# Patient Record
Sex: Female | Born: 1935 | Race: White | Hispanic: No
Health system: Southern US, Community
[De-identification: ages and names within clinical notes are randomized; demographics above are authoritative.]

---

## 2016-09-23 DIAGNOSIS — I5033 Acute on chronic diastolic (congestive) heart failure: Secondary | ICD-10-CM | POA: Diagnosis not present

## 2016-09-23 DIAGNOSIS — N182 Chronic kidney disease, stage 2 (mild): Secondary | ICD-10-CM | POA: Diagnosis not present

## 2016-09-23 DIAGNOSIS — E119 Type 2 diabetes mellitus without complications: Secondary | ICD-10-CM | POA: Diagnosis not present

## 2016-09-23 DIAGNOSIS — I160 Hypertensive urgency: Secondary | ICD-10-CM | POA: Diagnosis not present

## 2016-09-23 DIAGNOSIS — E039 Hypothyroidism, unspecified: Secondary | ICD-10-CM

## 2016-09-23 DIAGNOSIS — I480 Paroxysmal atrial fibrillation: Secondary | ICD-10-CM | POA: Diagnosis not present

## 2016-09-23 DIAGNOSIS — I10 Essential (primary) hypertension: Secondary | ICD-10-CM | POA: Diagnosis not present

## 2016-09-24 DIAGNOSIS — I249 Acute ischemic heart disease, unspecified: Secondary | ICD-10-CM | POA: Diagnosis not present

## 2016-09-24 DIAGNOSIS — I10 Essential (primary) hypertension: Secondary | ICD-10-CM | POA: Diagnosis not present

## 2016-09-24 DIAGNOSIS — I480 Paroxysmal atrial fibrillation: Secondary | ICD-10-CM

## 2016-09-24 DIAGNOSIS — R079 Chest pain, unspecified: Secondary | ICD-10-CM | POA: Diagnosis not present

## 2016-09-24 DIAGNOSIS — E119 Type 2 diabetes mellitus without complications: Secondary | ICD-10-CM | POA: Diagnosis not present

## 2016-09-24 DIAGNOSIS — I160 Hypertensive urgency: Secondary | ICD-10-CM

## 2016-09-24 DIAGNOSIS — N182 Chronic kidney disease, stage 2 (mild): Secondary | ICD-10-CM | POA: Diagnosis not present

## 2016-09-24 DIAGNOSIS — E039 Hypothyroidism, unspecified: Secondary | ICD-10-CM | POA: Diagnosis not present

## 2016-09-24 DIAGNOSIS — I5033 Acute on chronic diastolic (congestive) heart failure: Secondary | ICD-10-CM | POA: Diagnosis not present

## 2016-09-25 DIAGNOSIS — R079 Chest pain, unspecified: Secondary | ICD-10-CM

## 2019-03-10 DIAGNOSIS — J90 Pleural effusion, not elsewhere classified: Secondary | ICD-10-CM

## 2019-03-10 DIAGNOSIS — I361 Nonrheumatic tricuspid (valve) insufficiency: Secondary | ICD-10-CM

## 2019-03-10 DIAGNOSIS — I350 Nonrheumatic aortic (valve) stenosis: Secondary | ICD-10-CM

## 2019-03-10 DIAGNOSIS — I160 Hypertensive urgency: Secondary | ICD-10-CM | POA: Diagnosis not present

## 2019-03-11 DIAGNOSIS — I160 Hypertensive urgency: Secondary | ICD-10-CM | POA: Diagnosis not present

## 2019-04-26 DIAGNOSIS — E119 Type 2 diabetes mellitus without complications: Secondary | ICD-10-CM

## 2019-04-26 DIAGNOSIS — N189 Chronic kidney disease, unspecified: Secondary | ICD-10-CM | POA: Diagnosis not present

## 2019-04-26 DIAGNOSIS — I509 Heart failure, unspecified: Secondary | ICD-10-CM

## 2019-04-26 DIAGNOSIS — I160 Hypertensive urgency: Secondary | ICD-10-CM | POA: Diagnosis not present

## 2019-04-26 DIAGNOSIS — R9431 Abnormal electrocardiogram [ECG] [EKG]: Secondary | ICD-10-CM | POA: Diagnosis not present

## 2019-04-27 DIAGNOSIS — R9431 Abnormal electrocardiogram [ECG] [EKG]: Secondary | ICD-10-CM | POA: Diagnosis not present

## 2019-04-27 DIAGNOSIS — I160 Hypertensive urgency: Secondary | ICD-10-CM | POA: Diagnosis not present

## 2019-04-27 DIAGNOSIS — N189 Chronic kidney disease, unspecified: Secondary | ICD-10-CM | POA: Diagnosis not present

## 2019-04-27 DIAGNOSIS — I509 Heart failure, unspecified: Secondary | ICD-10-CM | POA: Diagnosis not present

## 2019-04-27 DIAGNOSIS — I5031 Acute diastolic (congestive) heart failure: Secondary | ICD-10-CM

## 2019-04-28 DIAGNOSIS — I160 Hypertensive urgency: Secondary | ICD-10-CM | POA: Diagnosis not present

## 2019-04-28 DIAGNOSIS — I509 Heart failure, unspecified: Secondary | ICD-10-CM | POA: Diagnosis not present

## 2019-04-28 DIAGNOSIS — N189 Chronic kidney disease, unspecified: Secondary | ICD-10-CM | POA: Diagnosis not present

## 2019-04-28 DIAGNOSIS — R9431 Abnormal electrocardiogram [ECG] [EKG]: Secondary | ICD-10-CM | POA: Diagnosis not present

## 2019-07-19 DIAGNOSIS — E876 Hypokalemia: Secondary | ICD-10-CM

## 2019-07-19 DIAGNOSIS — R531 Weakness: Secondary | ICD-10-CM

## 2019-07-19 DIAGNOSIS — E860 Dehydration: Secondary | ICD-10-CM

## 2019-07-19 DIAGNOSIS — E039 Hypothyroidism, unspecified: Secondary | ICD-10-CM

## 2019-07-19 DIAGNOSIS — N184 Chronic kidney disease, stage 4 (severe): Secondary | ICD-10-CM | POA: Diagnosis not present

## 2019-07-20 DIAGNOSIS — E876 Hypokalemia: Secondary | ICD-10-CM | POA: Diagnosis not present

## 2019-07-20 DIAGNOSIS — E860 Dehydration: Secondary | ICD-10-CM | POA: Diagnosis not present

## 2019-07-20 DIAGNOSIS — N184 Chronic kidney disease, stage 4 (severe): Secondary | ICD-10-CM | POA: Diagnosis not present

## 2019-07-20 DIAGNOSIS — R531 Weakness: Secondary | ICD-10-CM | POA: Diagnosis not present

## 2019-07-21 DIAGNOSIS — E876 Hypokalemia: Secondary | ICD-10-CM | POA: Diagnosis not present

## 2019-07-21 DIAGNOSIS — N184 Chronic kidney disease, stage 4 (severe): Secondary | ICD-10-CM | POA: Diagnosis not present

## 2019-07-21 DIAGNOSIS — R531 Weakness: Secondary | ICD-10-CM | POA: Diagnosis not present

## 2019-07-21 DIAGNOSIS — E860 Dehydration: Secondary | ICD-10-CM | POA: Diagnosis not present

## 2019-07-22 DIAGNOSIS — E876 Hypokalemia: Secondary | ICD-10-CM | POA: Diagnosis not present

## 2019-07-22 DIAGNOSIS — R531 Weakness: Secondary | ICD-10-CM | POA: Diagnosis not present

## 2019-07-22 DIAGNOSIS — E860 Dehydration: Secondary | ICD-10-CM | POA: Diagnosis not present

## 2019-07-22 DIAGNOSIS — N184 Chronic kidney disease, stage 4 (severe): Secondary | ICD-10-CM | POA: Diagnosis not present

## 2019-07-23 DIAGNOSIS — R531 Weakness: Secondary | ICD-10-CM | POA: Diagnosis not present

## 2019-07-23 DIAGNOSIS — I361 Nonrheumatic tricuspid (valve) insufficiency: Secondary | ICD-10-CM | POA: Diagnosis not present

## 2019-07-23 DIAGNOSIS — E860 Dehydration: Secondary | ICD-10-CM | POA: Diagnosis not present

## 2019-07-23 DIAGNOSIS — I350 Nonrheumatic aortic (valve) stenosis: Secondary | ICD-10-CM | POA: Diagnosis not present

## 2019-07-23 DIAGNOSIS — I340 Nonrheumatic mitral (valve) insufficiency: Secondary | ICD-10-CM

## 2019-07-23 DIAGNOSIS — E876 Hypokalemia: Secondary | ICD-10-CM | POA: Diagnosis not present

## 2019-07-23 DIAGNOSIS — N184 Chronic kidney disease, stage 4 (severe): Secondary | ICD-10-CM | POA: Diagnosis not present

## 2019-07-24 DIAGNOSIS — E876 Hypokalemia: Secondary | ICD-10-CM | POA: Diagnosis not present

## 2019-07-24 DIAGNOSIS — N184 Chronic kidney disease, stage 4 (severe): Secondary | ICD-10-CM | POA: Diagnosis not present

## 2019-07-24 DIAGNOSIS — R531 Weakness: Secondary | ICD-10-CM | POA: Diagnosis not present

## 2019-07-24 DIAGNOSIS — E860 Dehydration: Secondary | ICD-10-CM | POA: Diagnosis not present

## 2019-07-25 DIAGNOSIS — R531 Weakness: Secondary | ICD-10-CM | POA: Diagnosis not present

## 2019-07-25 DIAGNOSIS — N184 Chronic kidney disease, stage 4 (severe): Secondary | ICD-10-CM | POA: Diagnosis not present

## 2019-07-25 DIAGNOSIS — E876 Hypokalemia: Secondary | ICD-10-CM | POA: Diagnosis not present

## 2019-07-25 DIAGNOSIS — E860 Dehydration: Secondary | ICD-10-CM | POA: Diagnosis not present

## 2019-07-26 DIAGNOSIS — R531 Weakness: Secondary | ICD-10-CM | POA: Diagnosis not present

## 2019-07-26 DIAGNOSIS — E860 Dehydration: Secondary | ICD-10-CM | POA: Diagnosis not present

## 2019-07-26 DIAGNOSIS — N184 Chronic kidney disease, stage 4 (severe): Secondary | ICD-10-CM | POA: Diagnosis not present

## 2019-07-26 DIAGNOSIS — E876 Hypokalemia: Secondary | ICD-10-CM | POA: Diagnosis not present

## 2019-07-27 DIAGNOSIS — E876 Hypokalemia: Secondary | ICD-10-CM | POA: Diagnosis not present

## 2019-07-27 DIAGNOSIS — R531 Weakness: Secondary | ICD-10-CM | POA: Diagnosis not present

## 2019-07-27 DIAGNOSIS — N184 Chronic kidney disease, stage 4 (severe): Secondary | ICD-10-CM | POA: Diagnosis not present

## 2019-07-27 DIAGNOSIS — E860 Dehydration: Secondary | ICD-10-CM | POA: Diagnosis not present

## 2019-07-28 DIAGNOSIS — N184 Chronic kidney disease, stage 4 (severe): Secondary | ICD-10-CM | POA: Diagnosis not present

## 2019-07-28 DIAGNOSIS — R531 Weakness: Secondary | ICD-10-CM | POA: Diagnosis not present

## 2019-07-28 DIAGNOSIS — E860 Dehydration: Secondary | ICD-10-CM | POA: Diagnosis not present

## 2019-07-28 DIAGNOSIS — E876 Hypokalemia: Secondary | ICD-10-CM | POA: Diagnosis not present

## 2019-07-29 DIAGNOSIS — E876 Hypokalemia: Secondary | ICD-10-CM | POA: Diagnosis not present

## 2019-07-29 DIAGNOSIS — N184 Chronic kidney disease, stage 4 (severe): Secondary | ICD-10-CM | POA: Diagnosis not present

## 2019-07-29 DIAGNOSIS — R531 Weakness: Secondary | ICD-10-CM | POA: Diagnosis not present

## 2019-07-29 DIAGNOSIS — E860 Dehydration: Secondary | ICD-10-CM | POA: Diagnosis not present

## 2019-07-30 ENCOUNTER — Encounter (HOSPITAL_COMMUNITY): Payer: Medicare Other

## 2019-07-30 ENCOUNTER — Inpatient Hospital Stay (HOSPITAL_COMMUNITY): Payer: Medicare Other

## 2019-07-30 ENCOUNTER — Other Ambulatory Visit: Payer: Self-pay

## 2019-07-30 ENCOUNTER — Encounter (HOSPITAL_COMMUNITY): Payer: Self-pay | Admitting: Family Medicine

## 2019-07-30 ENCOUNTER — Inpatient Hospital Stay (HOSPITAL_COMMUNITY)
Admission: AD | Admit: 2019-07-30 | Discharge: 2019-07-30 | DRG: 871 | Disposition: A | Discharge status: Hospice | Payer: Medicare Other | Source: Other Acute Inpatient Hospital | Attending: Internal Medicine | Admitting: Internal Medicine

## 2019-07-30 DIAGNOSIS — D649 Anemia, unspecified: Secondary | ICD-10-CM | POA: Diagnosis present

## 2019-07-30 DIAGNOSIS — L89626 Pressure-induced deep tissue damage of left heel: Secondary | ICD-10-CM | POA: Diagnosis present

## 2019-07-30 DIAGNOSIS — Z66 Do not resuscitate: Secondary | ICD-10-CM | POA: Diagnosis present

## 2019-07-30 DIAGNOSIS — Z20822 Contact with and (suspected) exposure to covid-19: Secondary | ICD-10-CM | POA: Diagnosis present

## 2019-07-30 DIAGNOSIS — Z79899 Other long term (current) drug therapy: Secondary | ICD-10-CM

## 2019-07-30 DIAGNOSIS — G9341 Metabolic encephalopathy: Secondary | ICD-10-CM | POA: Diagnosis present

## 2019-07-30 DIAGNOSIS — Z515 Encounter for palliative care: Secondary | ICD-10-CM | POA: Diagnosis present

## 2019-07-30 DIAGNOSIS — L03116 Cellulitis of left lower limb: Secondary | ICD-10-CM | POA: Diagnosis present

## 2019-07-30 DIAGNOSIS — N179 Acute kidney failure, unspecified: Secondary | ICD-10-CM | POA: Diagnosis present

## 2019-07-30 DIAGNOSIS — L0390 Cellulitis, unspecified: Secondary | ICD-10-CM

## 2019-07-30 DIAGNOSIS — E871 Hypo-osmolality and hyponatremia: Secondary | ICD-10-CM | POA: Diagnosis present

## 2019-07-30 DIAGNOSIS — E876 Hypokalemia: Secondary | ICD-10-CM | POA: Diagnosis present

## 2019-07-30 DIAGNOSIS — N184 Chronic kidney disease, stage 4 (severe): Secondary | ICD-10-CM | POA: Diagnosis present

## 2019-07-30 DIAGNOSIS — R54 Age-related physical debility: Secondary | ICD-10-CM | POA: Diagnosis present

## 2019-07-30 DIAGNOSIS — J9601 Acute respiratory failure with hypoxia: Secondary | ICD-10-CM | POA: Diagnosis present

## 2019-07-30 DIAGNOSIS — I2510 Atherosclerotic heart disease of native coronary artery without angina pectoris: Secondary | ICD-10-CM | POA: Diagnosis present

## 2019-07-30 DIAGNOSIS — L89616 Pressure-induced deep tissue damage of right heel: Secondary | ICD-10-CM | POA: Diagnosis present

## 2019-07-30 DIAGNOSIS — I482 Chronic atrial fibrillation, unspecified: Secondary | ICD-10-CM | POA: Diagnosis present

## 2019-07-30 DIAGNOSIS — L89310 Pressure ulcer of right buttock, unstageable: Secondary | ICD-10-CM | POA: Diagnosis present

## 2019-07-30 DIAGNOSIS — E039 Hypothyroidism, unspecified: Secondary | ICD-10-CM | POA: Diagnosis present

## 2019-07-30 DIAGNOSIS — E1122 Type 2 diabetes mellitus with diabetic chronic kidney disease: Secondary | ICD-10-CM | POA: Diagnosis present

## 2019-07-30 DIAGNOSIS — K922 Gastrointestinal hemorrhage, unspecified: Secondary | ICD-10-CM | POA: Diagnosis present

## 2019-07-30 DIAGNOSIS — T460X5A Adverse effect of cardiac-stimulant glycosides and drugs of similar action, initial encounter: Secondary | ICD-10-CM | POA: Diagnosis present

## 2019-07-30 DIAGNOSIS — L89150 Pressure ulcer of sacral region, unstageable: Secondary | ICD-10-CM | POA: Diagnosis present

## 2019-07-30 DIAGNOSIS — I130 Hypertensive heart and chronic kidney disease with heart failure and stage 1 through stage 4 chronic kidney disease, or unspecified chronic kidney disease: Secondary | ICD-10-CM | POA: Diagnosis present

## 2019-07-30 DIAGNOSIS — I5033 Acute on chronic diastolic (congestive) heart failure: Secondary | ICD-10-CM | POA: Diagnosis present

## 2019-07-30 DIAGNOSIS — R0602 Shortness of breath: Secondary | ICD-10-CM

## 2019-07-30 DIAGNOSIS — I10 Essential (primary) hypertension: Secondary | ICD-10-CM | POA: Diagnosis present

## 2019-07-30 DIAGNOSIS — R1310 Dysphagia, unspecified: Secondary | ICD-10-CM | POA: Diagnosis present

## 2019-07-30 DIAGNOSIS — E1165 Type 2 diabetes mellitus with hyperglycemia: Secondary | ICD-10-CM | POA: Diagnosis present

## 2019-07-30 DIAGNOSIS — A419 Sepsis, unspecified organism: Principal | ICD-10-CM | POA: Diagnosis present

## 2019-07-30 DIAGNOSIS — J90 Pleural effusion, not elsewhere classified: Secondary | ICD-10-CM | POA: Diagnosis not present

## 2019-07-30 DIAGNOSIS — N390 Urinary tract infection, site not specified: Secondary | ICD-10-CM | POA: Insufficient documentation

## 2019-07-30 LAB — CBC WITH DIFFERENTIAL/PLATELET
Abs Immature Granulocytes: 0.21 10*3/uL — ABNORMAL HIGH (ref 0.00–0.07)
Basophils Absolute: 0 10*3/uL (ref 0.0–0.1)
Basophils Relative: 0 %
Eosinophils Absolute: 0 10*3/uL (ref 0.0–0.5)
Eosinophils Relative: 0 %
HCT: 26.3 % — ABNORMAL LOW (ref 36.0–46.0)
Hemoglobin: 8.8 g/dL — ABNORMAL LOW (ref 12.0–15.0)
Immature Granulocytes: 1 %
Lymphocytes Relative: 1 %
Lymphs Abs: 0.3 10*3/uL — ABNORMAL LOW (ref 0.7–4.0)
MCH: 28.1 pg (ref 26.0–34.0)
MCHC: 33.5 g/dL (ref 30.0–36.0)
MCV: 84 fL (ref 80.0–100.0)
Monocytes Absolute: 0.7 10*3/uL (ref 0.1–1.0)
Monocytes Relative: 3 %
Neutro Abs: 20.6 10*3/uL — ABNORMAL HIGH (ref 1.7–7.7)
Neutrophils Relative %: 95 %
Platelets: 183 10*3/uL (ref 150–400)
RBC: 3.13 MIL/uL — ABNORMAL LOW (ref 3.87–5.11)
RDW: 16.5 % — ABNORMAL HIGH (ref 11.5–15.5)
WBC: 21.8 10*3/uL — ABNORMAL HIGH (ref 4.0–10.5)
nRBC: 0.1 % (ref 0.0–0.2)

## 2019-07-30 LAB — COMPREHENSIVE METABOLIC PANEL
ALT: 11 U/L (ref 0–44)
AST: 20 U/L (ref 15–41)
Albumin: 2 g/dL — ABNORMAL LOW (ref 3.5–5.0)
Alkaline Phosphatase: 144 U/L — ABNORMAL HIGH (ref 38–126)
Anion gap: 12 (ref 5–15)
BUN: 99 mg/dL — ABNORMAL HIGH (ref 8–23)
CO2: 21 mmol/L — ABNORMAL LOW (ref 22–32)
Calcium: 9.5 mg/dL (ref 8.9–10.3)
Chloride: 98 mmol/L (ref 98–111)
Creatinine, Ser: 2.55 mg/dL — ABNORMAL HIGH (ref 0.44–1.00)
GFR calc Af Amer: 19 mL/min — ABNORMAL LOW (ref >60)
GFR calc non Af Amer: 16 mL/min — ABNORMAL LOW (ref >60)
Glucose, Bld: 197 mg/dL — ABNORMAL HIGH (ref 70–99)
Potassium: 4.7 mmol/L (ref 3.5–5.1)
Sodium: 131 mmol/L — ABNORMAL LOW (ref 135–145)
Total Bilirubin: 1 mg/dL (ref 0.3–1.2)
Total Protein: 5 g/dL — ABNORMAL LOW (ref 6.5–8.1)

## 2019-07-30 LAB — TYPE AND SCREEN
ABO/RH(D): O POS
Antibody Screen: NEGATIVE

## 2019-07-30 LAB — GLUCOSE, CAPILLARY
Glucose-Capillary: 198 mg/dL — ABNORMAL HIGH (ref 70–99)
Glucose-Capillary: 185 mg/dL — ABNORMAL HIGH (ref 70–99)

## 2019-07-30 LAB — ABO/RH: ABO/RH(D): O POS

## 2019-07-30 LAB — HEMOGLOBIN A1C
Hgb A1c MFr Bld: 7.8 % — ABNORMAL HIGH (ref 4.8–5.6)
Mean Plasma Glucose: 177.16 mg/dL

## 2019-07-30 MED ORDER — PANTOPRAZOLE SODIUM 40 MG PO TBEC
40.0000 mg | DELAYED_RELEASE_TABLET | Freq: Two times a day (BID) | ORAL | Status: DC
  Administered 2019-07-30: 40 mg via ORAL
  Filled 2019-07-30 (×2): qty 1

## 2019-07-30 MED ORDER — LORAZEPAM 2 MG/ML PO CONC: 1.0000 mg | ORAL | Status: DC | PRN

## 2019-07-30 MED ORDER — ONDANSETRON HCL 4 MG/2ML IJ SOLN: 4.0000 mg | Freq: Four times a day (QID) | INTRAMUSCULAR | Status: DC | PRN

## 2019-07-30 MED ORDER — SODIUM CHLORIDE 0.9% IV SOLUTION: Freq: Once | INTRAVENOUS | Status: DC

## 2019-07-30 MED ORDER — SODIUM CHLORIDE 0.9 % IV SOLN: 250.0000 mL | INTRAVENOUS | Status: DC | PRN

## 2019-07-30 MED ORDER — HALOPERIDOL LACTATE 5 MG/ML IJ SOLN: 0.5000 mg | INTRAMUSCULAR | Status: DC | PRN

## 2019-07-30 MED ORDER — VANCOMYCIN HCL IN DEXTROSE 1-5 GM/200ML-% IV SOLN
1000.0000 mg | Freq: Once | INTRAVENOUS | Status: AC
  Administered 2019-07-30: 1000 mg via INTRAVENOUS
  Filled 2019-07-30: qty 200

## 2019-07-30 MED ORDER — HALOPERIDOL 0.5 MG PO TABS
0.5000 mg | ORAL_TABLET | ORAL | Status: DC | PRN
  Filled 2019-07-30: qty 1

## 2019-07-30 MED ORDER — SODIUM CHLORIDE 0.9% FLUSH: 3.0000 mL | Freq: Two times a day (BID) | INTRAVENOUS | Status: DC

## 2019-07-30 MED ORDER — ORAL CARE MOUTH RINSE: 15.0000 mL | Freq: Two times a day (BID) | OROMUCOSAL | Status: DC

## 2019-07-30 MED ORDER — ONDANSETRON 4 MG PO TBDP: 4.0000 mg | ORAL_TABLET | Freq: Four times a day (QID) | ORAL | Status: DC | PRN

## 2019-07-30 MED ORDER — SODIUM CHLORIDE 0.9% FLUSH
3.0000 mL | Freq: Two times a day (BID) | INTRAVENOUS | Status: DC
  Administered 2019-07-30 (×2): 3 mL via INTRAVENOUS

## 2019-07-30 MED ORDER — ACETAMINOPHEN 650 MG RE SUPP: 650.0000 mg | Freq: Four times a day (QID) | RECTAL | Status: DC | PRN

## 2019-07-30 MED ORDER — SODIUM CHLORIDE 0.9% FLUSH: 3.0000 mL | INTRAVENOUS | Status: DC | PRN

## 2019-07-30 MED ORDER — SENNOSIDES-DOCUSATE SODIUM 8.6-50 MG PO TABS: 1.0000 | ORAL_TABLET | Freq: Every evening | ORAL | Status: DC | PRN

## 2019-07-30 MED ORDER — HYDROMORPHONE HCL 1 MG/ML IJ SOLN
0.5000 mg | INTRAMUSCULAR | Status: DC | PRN
  Administered 2019-07-30: 0.5 mg via INTRAVENOUS
  Filled 2019-07-30: qty 1

## 2019-07-30 MED ORDER — HALOPERIDOL LACTATE 2 MG/ML PO CONC
0.5000 mg | ORAL | Status: DC | PRN
  Filled 2019-07-30: qty 0.3

## 2019-07-30 MED ORDER — HYDROCODONE-ACETAMINOPHEN 5-325 MG PO TABS: 1.0000 | ORAL_TABLET | Freq: Four times a day (QID) | ORAL | Status: DC | PRN

## 2019-07-30 MED ORDER — LORAZEPAM 1 MG PO TABS: 1.0000 mg | ORAL_TABLET | ORAL | Status: DC | PRN

## 2019-07-30 MED ORDER — INSULIN ASPART 100 UNIT/ML ~~LOC~~ SOLN
0.0000 [IU] | Freq: Three times a day (TID) | SUBCUTANEOUS | Status: DC
  Administered 2019-07-30: 2 [IU] via SUBCUTANEOUS

## 2019-07-30 MED ORDER — HYDROMORPHONE HCL 1 MG/ML IJ SOLN: 0.5000 mg | INTRAMUSCULAR | 0 refills | Status: AC | PRN

## 2019-07-30 MED ORDER — LORAZEPAM 2 MG/ML PO CONC: 1.0000 mg | ORAL | 0 refills | Status: AC | PRN

## 2019-07-30 MED ORDER — VANCOMYCIN VARIABLE DOSE PER UNSTABLE RENAL FUNCTION (PHARMACIST DOSING): Status: DC

## 2019-07-30 MED ORDER — LORAZEPAM 2 MG/ML IJ SOLN
1.0000 mg | INTRAMUSCULAR | Status: DC | PRN
  Administered 2019-07-30: 1 mg via INTRAVENOUS
  Filled 2019-07-30: qty 1

## 2019-07-30 MED ORDER — CHLORHEXIDINE GLUCONATE CLOTH 2 % EX PADS: 6.0000 | MEDICATED_PAD | Freq: Every day | CUTANEOUS | Status: DC

## 2019-07-30 MED ORDER — ONDANSETRON HCL 4 MG PO TABS: 4.0000 mg | ORAL_TABLET | Freq: Four times a day (QID) | ORAL | Status: DC | PRN

## 2019-07-30 MED ORDER — LORAZEPAM 2 MG/ML IJ SOLN: 1.0000 mg | INTRAMUSCULAR | 0 refills | Status: AC | PRN

## 2019-07-30 MED ORDER — LABETALOL HCL 5 MG/ML IV SOLN: 10.0000 mg | INTRAVENOUS | Status: DC | PRN

## 2019-07-30 MED ORDER — ACETAMINOPHEN 325 MG PO TABS: 650.0000 mg | ORAL_TABLET | Freq: Four times a day (QID) | ORAL | Status: DC | PRN

## 2019-07-30 MED ORDER — SUCRALFATE 1 G PO TABS: 1.0000 g | ORAL_TABLET | Freq: Three times a day (TID) | ORAL | Status: DC

## 2019-07-30 MED ORDER — INSULIN ASPART 100 UNIT/ML ~~LOC~~ SOLN: 0.0000 [IU] | Freq: Every day | SUBCUTANEOUS | Status: DC

## 2019-07-30 MED ORDER — DIPHENHYDRAMINE HCL 50 MG/ML IJ SOLN: 12.5000 mg | INTRAMUSCULAR | Status: DC | PRN

## 2019-07-30 MED ORDER — LEVOTHYROXINE SODIUM 25 MCG PO TABS
25.0000 ug | ORAL_TABLET | Freq: Every day | ORAL | Status: DC
  Administered 2019-07-30: 25 ug via ORAL
  Filled 2019-07-30: qty 1

## 2019-08-26 NOTE — Consult Note (Signed)
WOC Nurse Consult Note: Patient receiving care in [facility] [phone]. Reason for Consult: LE wounds Wound type: Right posterior heel has a DTPI that measures 4 cm x 4 cm and is 100% purple.  A foam dressing is in place to this wound.  The LLE from the knee to the dorsum of the foot is cool, dark purple, and I cannot palpate a dorsalis pedis pulse.  There is a small area just below the knee where some of the overlying skin is missing and has an Aquacel pad over it; otherwise the multiple non-adherent guaze dressings are CDI.  I am assuming the appearance of the leg could be a vascular flow issue.  The origin, however, is not clear to me at this time. Pressure Injury POA: Yes/No/NA Measurement: Wound bed: Drainage (amount, consistency, odor)  Periwound: Dressing procedure/placement/frequency: I have ordered an air mattress with a low air loss feature, bilateral heel lift prevalon boots, and to moisten any dressings adhering to the LLE with saline. The apply non-adherent pads (Telfa or Curad) to LLE. Secure with kerlex. Change Kerlex daily. Change all non-adherent pads with drainage. Monitor the wound area(s) for worsening of condition such as: Signs/symptoms of infection,  Increase in size,  Development of or worsening of odor, Development of pain, or increased pain at the affected locations.  Notify the medical team if any of these develop.  Thank you for the consult.  Discussed plan of care with the patient and bedside nurse.  [place] nurse will not follow at this time.  Please re-consult the [place] team if needed.  Val Riles, RN, MSN, CWOCN, CNS-BC, pager [phone]

## 2019-08-26 NOTE — TOC Initial Note (Signed)
Transition of Care [facility]) - Initial/Assessment Note    Patient Details  Name: Melinda Hernandez MRN: [id number] Date of Birth: 1929-10-26  Transition of Care [facility]) CM/SW Contact:    Bartholomew Crews, RN Phone Number: [phone] 2019/08/14, 12:51 PM  Clinical Narrative:                  Notified by MD of the need for inpatient hospice in [facility]. Spoke with son, Ludwig Clarks, at the bedside. Referral called in to liaison at [place]. TOC following for transition needs.   Expected Discharge Plan: [place] Barriers to Discharge: Continued Medical Work up   Patient Goals and CMS Choice Patient states their goals for this hospitalization and ongoing recovery are:: inpatient hospice at [facility].gov Compare Post Acute Care list provided to:: Patient Represenative (must comment) Choice offered to / list presented to : Adult Children  Expected Discharge Plan and Services Expected Discharge Plan: [place] In-house Referral: Clinical Social Work Discharge Planning Services: CM Consult Post Acute Care Choice: Hospice Living arrangements for the past 2 months: [place]                                      Prior Living Arrangements/Services Living arrangements for the past 2 months: Single Family Home Lives with:: Adult Children, Self                   Activities of Daily Living Home Assistive Devices/Equipment: (confused) ADL Screening (condition at time of admission) Patient's cognitive ability adequate to safely complete daily activities?: No Is the patient deaf or have difficulty hearing?: Yes Does the patient have difficulty seeing, even when wearing glasses/contacts?: No Does the patient have difficulty concentrating, remembering, or making decisions?: Yes Patient able to express need for assistance with ADLs?: No Does the patient have difficulty dressing or bathing?: Yes Independently performs ADLs?:  No Communication: Independent Dressing (OT): Dependent Is this a change from baseline?: Pre-admission baseline Grooming: Dependent Is this a change from baseline?: Pre-admission baseline Feeding: Dependent Is this a change from baseline?: Pre-admission baseline Bathing: Dependent Is this a change from baseline?: Pre-admission baseline Toileting: Dependent Is this a change from baseline?: Pre-admission baseline In/Out Bed: Dependent Is this a change from baseline?: Pre-admission baseline Walks in Home: Dependent Is this a change from baseline?: Pre-admission baseline Does the patient have difficulty walking or climbing stairs?: Yes Weakness of Legs: Both Weakness of Arms/Hands: Both  Permission Sought/Granted                  Emotional Assessment         Alcohol / Substance Use: Not Applicable Psych Involvement: No (comment)  Admission diagnosis:  Sepsis due to cellulitis ([place]) [L03.90, A41.9] Patient Active Problem List   Diagnosis Date Noted  . Symptomatic anemia Aug 14, 2019  . Sepsis due to cellulitis ([place]) 2019/08/14  . CAD (coronary artery disease) 14-Aug-2019  . Hypertension 08/14/19  . CKD (chronic kidney disease), stage IV ([place]) 2019-08-14  . Hypothyroidism 08-14-2019  . Dysphagia 08/14/19  . Uncontrolled diabetes mellitus with hyperglycemia ([place]) 08-14-19  . Acute on chronic diastolic CHF (congestive heart failure) ([place]) August 14, 2019  . Acute respiratory failure with hypoxia ([place]) 14-Aug-2019  . Bilateral pleural effusion 08/14/19   PCP:  Raina Mina., MD Pharmacy:   [place] [place] [facility] [address]  [place] [address] Phone: [phone] Fax: [phone]     Social Determinants of Health (SDOH) Interventions    Readmission Risk Interventions No flowsheet data found.

## 2019-08-26 NOTE — Progress Notes (Signed)
New Admission Note:  Arrival Method: Stretcher/EMS Mental Orientation:Alert to self only. Telemetry: NO Assessment: Completed Skin: Pale looking, BLE cool to touch, Bruising all over, Pressure ulcers to buttock and coccyx, Blood blister? LLE DTI's to both heels IV: NSL x 2 Pain: Denies Tubes: Foley cath  Safety Measures: Safety Fall Prevention Plan initiated.  Admission: Completed 5 M  Orientation: Patient has been orientated to the room, unit and the staff. Welcome booklet given.  Family: None Orders have been reviewed and implemented. Will continue to monitor the patient. Call light has been placed within reach and bed alarm has been activated.   Sima Matas BSN, RN  Phone Number: [phone]

## 2019-08-26 NOTE — Progress Notes (Addendum)
PROGRESS NOTE        PATIENT DETAILS Name: Melinda Hernandez Age: 86 y.o. Sex: female Date of Birth: 03/28/1929 Admit Date: 2019-08-08 Admitting Physician Vianne Bulls, MD PYK:DXIPJA, Clarita Crane., MD  Brief Narrative: Patient is a 86 y.o. female chronic left lower extremity wounds-followed by wound care at home, DM-2, HTN, CKD stage IV, chronic atrial fibrillation-transferred from [place] for evaluation of anemia (concern for GI bleeding).  Summary of [place] health course: Patient presented to [place] with generalized weakness-found to have sepsis from left lower extremity cellulitis and acute metabolic encephalopathy-started on IV antibiotics, IV fluid.  Hospital course complicated by acute hypoxemic respiratory failure secondary to decompensated diastolic heart failure/bilateral pleural effusions, AKI, and persistent anemia requiring a total of 6 units of PRBC.  Although FOBT was negative-there was still concern for possible GI bleeding.  During the course at [facility] health-cellulitis involving her left lower extremity-became purulent with concern for left leg abscess.  Significant events: 6/4>> transfer from [facility].  Significant studies: 6/4: CT abdomen/pelvis>> pending 6/4: CT left lower extremity>> pending 5/28: CT head>> motion degraded study, progression of atrophy and chronic microvascular ischemia since 2018 5/25: CT left lower extremity (at [facility])>> significant diffuse subcutaneous soft tissue swelling/edema/fluid consistent with severe cellulitis-no discrete drainable abscess.  No CT findings for myositis/pyomyositis. 5/25: CT pelvis (at [facility])>> sigmoid diverticulitis without diverticulitis, calcified/noncalcified uterine leiomyomata, increased stool in rectum, scattered subcutaneous edema throughout pelvis, perineum and proximal thigh question related to cellulitis or soft tissue edema/anasarca  Antimicrobial therapy: Vancomycin:  6/4>> Zyvox: 5/25>> 6/2 (at [facility]) Ancef: 6/2>> 6/3 (at [facility]) Diflucan: 6/2>> 6/3 (at [facility]) Ceftriaxone: 5/24 x 1(at [facility]) IV clindamycin 5/24 x 1 (at [facility])  Microbiology data: None  Procedures : None  Consults: Ortho  DVT Prophylaxis : SCD's  Subjective: Slow/lethargic- very slow to answer questions.  Assessment/Plan: Sepsis secondary to left lower extremity purulent cellulitis-abscess: Sepsis physiology is improved-however continues to have persistent leukocytosis.  Reportedly cultures at [facility] negative.  On exam-has purulent discharge from the opening in his left leg-see pictures below.  Repeat CT of the leg to see how extensive this abscess is-continue vancomycin-I have consulted orthopedics.   She appears in very poor overall health-advanced age-very frail-will not tolerate extensive procedures-we will await orthopedic evaluation-but have touched base with patient's who understands there will be limitations to her care-and that if patient worsens significantly-that hospice will be more than appropriate.  PAD: Left lower extremity somewhat colder than contralateral right lower extremity.  Obtain ABI.  AKI on CKD stage IV: AKI likely hemodynamically mediated in the setting of sepsis, acute illness-BUN is significantly elevated.  Supportive care for now-given how frail she is currently-do not think she would be a good long-term candidate for hemodialysis.  Acute hypoxemic respiratory failure secondary to decompensated diastolic heart failure/bilateral pleural effusions: Supportive care-due to worsening renal function-diuretics on hold.  Given how tenuous overall situation is-and the fact that she is only requiring 2 L of oxygen-and was able to lie flat-suspect we can hold off on thoracocentesis.  Anemia: Multifactorial etiology-suspect related to acute illness/AKI.  Concern for occult GI bleeding-however FOBT negative at [place]-Per son-patient is having brown stools.  Check CT  abdomen/pelvis to rule out retroperitoneal hematoma.  Given her frailty/tenuous overall clinical situation-hold off on endoscopic evaluation/GI consultation-especially since her stools are brown and negative FOBT.  Follow CBC-continue to hold antiplatelets.  ??  GI bleed: See above-we will asked RN to look at stools to see if bloody/black-on PPI for now.  See above regarding plans to hold off on GI/endoscopic evaluation for now.  Digoxin toxicity: Apparently received DigiFab at [facility].  Repeat digoxin level at [facility] on 5/31 was reportedly normal.  Acute metabolic encephalopathy: Multifactorial etiology from AKI, sepsis-still slow/lethargic and not at baseline.  CT head done at [facility] was negative for acute abnormalities.  She has a nonfocal exam-doubt any further benefit from further imaging.  Hopefully once her metabolic derangements are better-she will improve.  Dysphagia: Apparently was on a pured diet at [facility] health-repeat SLP eval.  HTN: BP reasonable-all antihypertensives on hold-on as needed labetalol.  DM-2: CBGs relatively stable-continue SSI-allow permissive hyperglycemia-not a candidate for tight glycemic control.  Oral hypoglycemic agents remain on hold  Recent Labs    08/20/19 0812  GLUCAP 198*   Hypothyroidism: Continue Synthroid  History of chronic atrial fibrillation: No longer on digoxin-due to concern for digoxin toxicity-rate currently controlled-appears to be in A. fib on telemetry monitoring.  Not a candidate for anticoagulation currently given severe anemia requiring PRBC transfusion and concern for GI bleeding.  History of CAD: No anginal symptoms-apparently was on aspirin/Plavix/Imdur prior to hospitalization at [facility] health-currently all antiplatelet agents on hold given severe anemia/concern for GI bleeding.  Anasarca/bilateral pleural effusions: Probably secondary to hypoalbuminemia, chronic diastolic heart failure.  Once  clinical situation is more stable-we can consider resuming diuretics.  Suspect the pleural effusion is transudate of-once a bit more stable-we can consider diagnostic thoracocentesis at some point.  She is able to lie flat-and only on 2 L of oxygen-therefore do not think we need to pursue thoracocentesis right away.  Funguria: Apparently was on Diflucan at [place] care: DNR in place.  Very frail-elderly female-with long hospital stay at [facility] health-transferred to [facility] for evaluation of anemia, heart failure, concern for GI bleeding.  Her left lower extremity-appears to have purulent drainage-and probably underlying abscess-left lower extremity appears to be cold compared to the contralateral leg-concern for underlying PAD.  Given her multiple medical comorbidities-severe frailty/deconditioning-advanced age-overall prognosis is very poor-do not think she will tolerate aggressive procedures.  Have touched base with patient's son over the phone this morning-he understands-have placed palliative care evaluation.  If patient declines significantly-she will need to be transition to full comfort measures.   Diet: Diet Order            Diet NPO time specified Except for: Ice Chips, Sips with Meds  Diet effective now               Code Status:  DNR  Family Communication: Spoke with son over phone  Disposition Plan: Status is: Inpatient  Remains inpatient appropriate because:Inpatient level of care appropriate due to severity of illness   Dispo: The patient is from: Home              Anticipated d/c is to: TBD              Anticipated d/c date is: > 3 days              Patient currently is not medically stable to d/c.        Barriers to Discharge:  Antimicrobial agents: Anti-infectives (From admission, onward)   Start     Dose/Rate Route Frequency Ordered Stop   2019/08/20 0430  vancomycin (VANCOCIN) IVPB 1000 mg/200  mL premix     1,000 mg 200 mL/hr over 60  Minutes Intravenous  Once 08-25-19 0415 August 25, 2019 0610   August 25, 2019 0415  vancomycin variable dose per unstable renal function (pharmacist dosing)      Does not apply See admin instructions 08/25/19 0415         Time spent: 35 minutes-Greater than 50% of this time was spent in counseling, explanation of diagnosis, planning of further management, and coordination of care.  MEDICATIONS: Scheduled Meds: . sodium chloride   Intravenous Once  . Chlorhexidine Gluconate Cloth  6 each Topical Daily  . insulin aspart  0-5 Units Subcutaneous QHS  . insulin aspart  0-9 Units Subcutaneous TID WC  . levothyroxine  25 mcg Oral Q0600  . mouth rinse  15 mL Mouth Rinse BID  . pantoprazole  40 mg Oral BID  . sodium chloride flush  3 mL Intravenous Q12H  . sodium chloride flush  3 mL Intravenous Q12H  . sucralfate  1 g Oral TID WC & HS  . vancomycin variable dose per unstable renal function (pharmacist dosing)   Does not apply See admin instructions   Continuous Infusions: . sodium chloride     PRN Meds:.sodium chloride, acetaminophen **OR** acetaminophen, HYDROcodone-acetaminophen, labetalol, ondansetron **OR** ondansetron (ZOFRAN) IV, senna-docusate, sodium chloride flush   PHYSICAL EXAM: Vital signs: Vitals:   2019-08-25 0052 08-25-2019 0400 August 25, 2019 0422 2019/08/25 0812  BP: (!) 178/54  (!) 141/53 (!) 156/62  Pulse: 60  75 71  Resp: 19  19 16   Temp: 98.2 F (36.8 C)  97.9 F (36.6 C) 98.2 F (36.8 C)  TempSrc:    Oral  SpO2: 98%  93% 92%  Weight:  47.9 kg     Filed Weights   2019-08-25 0400  Weight: 47.9 kg   There is no height or weight on file to calculate BMI.   Gen Exam: Awake-slow-lethargic but not in any distress. HEENT:atraumatic, normocephalic Chest: B/L clear to auscultation anteriorly CVS:S1S2 irregular Abdomen:soft non tender, non distended Extremities: See picture below.  Left lower extremity appears to be colder than right lower extremity. Neurology: Difficult exam-but  seems to be moving all 4 extremities. Skin: no rash           I have personally reviewed following labs and imaging studies  LABORATORY DATA: CBC: Recent Labs  Lab 08-25-2019 0450  WBC 21.8*  NEUTROABS 20.6*  HGB 8.8*  HCT 26.3*  MCV 84.0  PLT 160    Basic Metabolic Panel: Recent Labs  Lab August 25, 2019 0450  NA 131*  K 4.7  CL 98  CO2 21*  GLUCOSE 197*  BUN 99*  CREATININE 2.55*  CALCIUM 9.5    GFR: CrCl cannot be calculated (Unknown ideal weight.).  Liver Function Tests: Recent Labs  Lab 08-25-19 0450  AST 20  ALT 11  ALKPHOS 144*  BILITOT 1.0  PROT 5.0*  ALBUMIN 2.0*   No results for input(s): LIPASE, AMYLASE in the last 168 hours. No results for input(s): AMMONIA in the last 168 hours.  Coagulation Profile: No results for input(s): INR, PROTIME in the last 168 hours.  Cardiac Enzymes: No results for input(s): CKTOTAL, CKMB, CKMBINDEX, TROPONINI in the last 168 hours.  BNP (last 3 results) No results for input(s): PROBNP in the last 8760 hours.  Lipid Profile: No results for input(s): CHOL, HDL, LDLCALC, TRIG, CHOLHDL, LDLDIRECT in the last 72 hours.  Thyroid Function Tests: No results for input(s): TSH, T4TOTAL, FREET4, T3FREE, THYROIDAB in the last  72 hours.  Anemia Panel: No results for input(s): VITAMINB12, FOLATE, FERRITIN, TIBC, IRON, RETICCTPCT in the last 72 hours.  Urine analysis: No results found for: COLORURINE, APPEARANCEUR, LABSPEC, PHURINE, GLUCOSEU, HGBUR, BILIRUBINUR, KETONESUR, PROTEINUR, UROBILINOGEN, NITRITE, LEUKOCYTESUR  Sepsis Labs: Lactic Acid, Venous No results found for: LATICACIDVEN  MICROBIOLOGY: No results found for this or any previous visit (from the past 240 hour(s)).  RADIOLOGY STUDIES/RESULTS: No results found.   LOS: 0 days   Oren Binet, MD  [facility]    To contact the attending provider between 7A-7P or the covering provider during after hours 7P-7A, please log into the web  site [email or web address] and access using universal  password for that web site. If you do not have the password, please call the hospital operator.  August 22, 2019, 10:13 AM

## 2019-08-26 NOTE — Discharge Summary (Signed)
PATIENT DETAILS Name: Melinda Hernandez Age: 86 y.o. Sex: female Date of Birth: 1929-04-29 MRN: [id number]. Admitting Physician: Vianne Bulls, MD [facility]:[id number], Clarita Crane., MD  Admit Date: 2019-08-07 Discharge date: 08/07/19  Recommendations for Outpatient Follow-up:  1. Optimize comfort care.  Admitted From:  Transferred from [place].  Disposition: Residential Hospice     Home Health: No  Equipment/Devices: None  Discharge Condition: Stable  CODE STATUS:  DNR  Diet recommendation:  Diet Order            Diet - low sodium heart healthy               Brief Narrative: Patient is a 86 y.o. female chronic left lower extremity wounds-followed by wound care at home, DM-2, HTN, CKD stage IV, chronic atrial fibrillation-transferred from [place] for evaluation of anemia (concern for GI bleeding).  Summary of [place] [facility] course: Patient presented to [place] with generalized weakness-found to have sepsis from left lower extremity cellulitis and acute metabolic encephalopathy-started on IV antibiotics, IV fluid.  Hospital course complicated by acute hypoxemic respiratory failure secondary to decompensated diastolic heart failure/bilateral pleural effusions, AKI, and persistent anemia requiring a total of 6 units of PRBC.  Although FOBT was negative-there was still concern for possible GI bleeding.  During the course at [facility] health-cellulitis involving her left lower extremity-became purulent with concern for left leg abscess.  Significant events: 6/4>> transfer from [facility]. 6/4>> after extensive discussion with family-transition to full comfort measures-residential hospice on discharge.  Significant studies: 6/4: CT abdomen/pelvis>> pending 6/4: CT left lower extremity>> diffuse/severe cellulitis of the entire left lower extremity and pelvic area, extensive gas throughout the subcutaneous tissue-findings highly worrisome of gas  gangrene. 5/28: CT head>> motion degraded study, progression of atrophy and chronic microvascular ischemia since 2018 5/25: CT left lower extremity (at [facility])>> significant diffuse subcutaneous soft tissue swelling/edema/fluid consistent with severe cellulitis-no discrete drainable abscess.  No CT findings for myositis/pyomyositis. 5/25: CT pelvis (at [facility])>> sigmoid diverticulitis without diverticulitis, calcified/noncalcified uterine leiomyomata, increased stool in rectum, scattered subcutaneous edema throughout pelvis, perineum and proximal thigh question related to cellulitis or soft tissue edema/anasarca  Antimicrobial therapy: Vancomycin: 6/4>> 6/4 Zyvox: 5/25>> 6/2 (at [facility]) Ancef: 6/2>> 6/3 (at [facility]) Diflucan: 6/2>> 6/3 (at [facility]) Ceftriaxone: 5/24 x 1(at [facility]) IV clindamycin 5/24 x 1 (at [facility])  Microbiology data: None  Procedures : None  Consults: Ortho  Brief Hospital Course: Sepsis secondary to left lower extremity purulent cellulitis-abscess:  Sepsis physiology improved-but continues to have significant leukocytosis.  On exam this morning had purulence from left leg-CT suggestive of gas in left lower extremity-with ongoing severe diffuse cellulitis.  Orthopedics consulted-recommendations were to proceed with I&D-but given patient's advanced age/tenuous clinical condition/frailty-family instead decided to transition to full comfort measures.  See discussion below.  All antibiotics were discontinued-comfort measures started.    PAD: Left lower extremity somewhat colder than contralateral right lower extremity.    Plans were to obtain ABI-however given extensive wounds-this was not possible.  Highly suspicious for underlying PAD and critical ischemia.  Family does not desire any further work-up including vascular surgery-have transition patient to full comfort measures.  AKI on CKD stage IV: AKI likely hemodynamically mediated in the setting of sepsis, acute illness-BUN is significantly  elevated.  Supportive care for now-given how frail she is currently-do not think she would be a good long-term candidate for hemodialysis.  In any event-family has decided to transition to full comfort measures.  Acute hypoxemic  respiratory failure secondary to decompensated diastolic heart failure/bilateral pleural effusions: All diuretic agents were held due to worsening AKI.  Thoracocentesis was contemplated but given tenuous clinical situation-in the fact that she was able to lie flat and was only on 2 L of oxygen-this was postponed.  As noted above-patient now has been transitioned to full comfort measures.    Anemia: Multifactorial etiology-suspect related to acute illness/AKI.  Concern for occult GI bleeding-however FOBT negative at [place]-Per son-patient is having brown stools.  Plans were to repeat a CT abdomen/pelvis to rule out a retroperitoneal hematoma.  Given frailty/tenuous clinical condition-all endoscopic procedures that were contemplated were held.  Goals of care discussion was done by this MD with family-starting comfort measures.   ??  GI bleed: Concern for occult GI bleeding-even though FOBT negative-as hemoglobin did not correct even with multiple units of PRBC transfusion.  Placed on empiric PPI-given frailty/tenuous clinical condition-not felt to be a good candidate for immediate endoscopic evaluation-after extensive discussion with family-have transition to full comfort measures.   Digoxin toxicity: Apparently received DigiFab at [facility].  Repeat digoxin level at [facility] on 5/31 was reportedly normal.  Acute metabolic encephalopathy: Multifactorial etiology from AKI, sepsis-still slow/lethargic/somnolent and not at baseline.  CT head done at [facility] was negative for acute abnormalities.  She has a nonfocal exam-doubt any further benefit from further imaging.Marland Kitchen  Dysphagia: Apparently was on a pured diet at [place]  HTN: BP  reasonable-all antihypertensives on hold  DM-2: CBGs relatively stable-all insulin products discontinued-comfort measures in effect.  Hypothyroidism:  No longer on Synthroid-comfort measures in effect.  History of chronic atrial fibrillation: No longer on digoxin-due to concern for digoxin toxicity-rate currently controlled-appears to be in A. fib on telemetry monitoring.  Not a candidate for anticoagulation currently given severe anemia requiring PRBC transfusion and concern for GI bleeding.  History of CAD: No anginal symptoms-apparently was on aspirin/Plavix/Imdur prior to hospitalization at [facility] health-currently all antiplatelet agents on hold given severe anemia/concern for GI bleeding.  Anasarca/bilateral pleural effusions: Probably secondary to hypoalbuminemia, chronic diastolic heart failure.  Plans were to follow-and to resume diuretics and consider thoracocentesis once clinical situation improved.  However now has been transitioned to comfort measures.   Funguria: Apparently was on Diflucan at [place] care/goals of care: DNR already in place.  Very frail-elderly female-with long hospital stay at [facility] health-transferred to [facility] for evaluation of anemia, heart failure, concern for GI bleeding.  Her left lower extremity-appears to have purulent drainage-and probably underlying abscess-left lower extremity appears to be cold compared to the contralateral leg-concern for underlying PAD with critical limb ischemia.  Given her multiple medical comorbidities-severe frailty/deconditioning-advanced age-overall prognosis is very poor-do not think she will tolerate aggressive procedures.  Discussed with son over the phone this morning-and then at bedside-he understands that patient is deteriorating slowly-and that further procedures/surgery mainly delayed inevitable.  He also thinks that his mother has suffered enough-and does not wish to proceed with any further  investigation/surgery for left leg and to transition to full comfort measures-and wants to see if we can get his mother to [facility] where she will be closer to family.  He understands that she will likely pass within a few days-a week or so.   RN pressure injury documentation: Pressure Injury August 01, 2019 Coccyx Medial Unstageable - Full thickness tissue loss in which the base of the injury is covered by slough (yellow, tan, gray, green or brown) and/or eschar (tan, brown or black)  in the wound bed. yellow eschar (Active)  08/03/2019 0125  Location: Coccyx  Location Orientation: Medial  Staging: Unstageable - Full thickness tissue loss in which the base of the injury is covered by slough (yellow, tan, gray, green or brown) and/or eschar (tan, brown or black) in the wound bed.  Wound Description (Comments): yellow eschar  Present on Admission: Yes     Pressure Injury 2019-08-03 Buttocks Right;Medial Unstageable - Full thickness tissue loss in which the base of the injury is covered by slough (yellow, tan, gray, green or brown) and/or eschar (tan, brown or black) in the wound bed. yellow eschar (Active)  08-03-19 0128  Location: Buttocks  Location Orientation: Right;Medial  Staging: Unstageable - Full thickness tissue loss in which the base of the injury is covered by slough (yellow, tan, gray, green or brown) and/or eschar (tan, brown or black) in the wound bed.  Wound Description (Comments): yellow eschar  Present on Admission: Yes     Pressure Injury 08-03-19 Heel Left Deep Tissue Pressure Injury - Purple or maroon localized area of discolored intact skin or blood-filled blister due to damage of underlying soft tissue from pressure and/or shear. purple, soft (Active)  Aug 03, 2019 0130  Location: Heel  Location Orientation: Left  Staging: Deep Tissue Pressure Injury - Purple or maroon localized area of discolored intact skin or blood-filled blister due to damage of underlying soft tissue from  pressure and/or shear.  Wound Description (Comments): purple, soft  Present on Admission: Yes     Pressure Injury August 03, 2019 Heel Right Deep Tissue Pressure Injury - Purple or maroon localized area of discolored intact skin or blood-filled blister due to damage of underlying soft tissue from pressure and/or shear. purple (Active)  Aug 03, 2019 0132  Location: Heel  Location Orientation: Right  Staging: Deep Tissue Pressure Injury - Purple or maroon localized area of discolored intact skin or blood-filled blister due to damage of underlying soft tissue from pressure and/or shear.  Wound Description (Comments): purple  Present on Admission: Yes     Pressure Injury 08/03/19 Heel Left;Lateral Deep Tissue Pressure Injury - Purple or maroon localized area of discolored intact skin or blood-filled blister due to damage of underlying soft tissue from pressure and/or shear. (Active)  August 03, 2019 0135  Location: Heel  Location Orientation: Left;Lateral  Staging: Deep Tissue Pressure Injury - Purple or maroon localized area of discolored intact skin or blood-filled blister due to damage of underlying soft tissue from pressure and/or shear.  Wound Description (Comments):   Present on Admission: Yes     Pressure Injury 08-03-19 Heel Left;Lateral;Proximal Deep Tissue Pressure Injury - Purple or maroon localized area of discolored intact skin or blood-filled blister due to damage of underlying soft tissue from pressure and/or shear. purple (Active)  08/03/19 0137  Location: Heel  Location Orientation: Left;Lateral;Proximal  Staging: Deep Tissue Pressure Injury - Purple or maroon localized area of discolored intact skin or blood-filled blister due to damage of underlying soft tissue from pressure and/or shear.  Wound Description (Comments): purple  Present on Admission: Yes     Discharge Diagnoses:  Principal Problem:   Sepsis due to cellulitis [facility]) Active Problems:   Symptomatic anemia   CAD (coronary  artery disease)   Hypertension   CKD (chronic kidney disease), stage IV (HCC)   Hypothyroidism   Dysphagia   Uncontrolled diabetes mellitus with hyperglycemia (HCC)   Acute on chronic diastolic CHF (congestive heart failure) (HCC)   Acute respiratory failure with hypoxia (HCC)   Bilateral pleural  effusion   Discharge Instructions:  Activity:  As tolerated with Full fall precautions use walker/cane & assistance as needed  Discharge Instructions    Diet - low sodium heart healthy   Complete by: As directed    Discharge wound care:   Complete by: As directed    moisten any dressings adhering to the LLE with saline. Then apply non-adherent pads (Telfa or Curad) to LLE. Secure with kerlex. Change Kerlex daily. Change all non-adherent pads with drainage.   Increase activity slowly   Complete by: As directed      Allergies as of 08-17-2019      Reactions   Bisoprolol Shortness Of Breath   Clonidine Shortness Of Breath   Contrast Media [iodinated Diagnostic Agents] Anaphylaxis   Doxazosin Shortness Of Breath   Metoprolol Shortness Of Breath   Ace Inhibitors Cough   Amlodipine Swelling      Medication List    STOP taking these medications   amLODipine 10 MG tablet Commonly known as: NORVASC   aspirin EC 81 MG tablet   clopidogrel 75 MG tablet Commonly known as: PLAVIX   digoxin 0.125 MG tablet Commonly known as: LANOXIN   furosemide 20 MG tablet Commonly known as: LASIX   glipiZIDE 10 MG 24 hr tablet Commonly known as: GLUCOTROL XL   hydrALAZINE 100 MG tablet Commonly known as: APRESOLINE   isosorbide mononitrate 60 MG 24 hr tablet Commonly known as: IMDUR   levothyroxine 25 MCG tablet Commonly known as: SYNTHROID   pantoprazole 40 MG tablet Commonly known as: PROTONIX   sitaGLIPtin 100 MG tablet Commonly known as: JANUVIA   traMADol 50 MG tablet Commonly known as: ULTRAM     TAKE these medications   HYDROmorphone 1 MG/ML injection Commonly known as:  DILAUDID Inject 0.5 mLs (0.5 mg total) into the vein every 2 (two) hours as needed for severe pain (or dyspnea).   LORazepam 2 MG/ML injection Commonly known as: ATIVAN Inject 0.5 mLs (1 mg total) into the vein every 4 (four) hours as needed for anxiety.   LORazepam 2 MG/ML concentrated solution Commonly known as: ATIVAN Place 0.5 mLs (1 mg total) under the tongue every 4 (four) hours as needed for anxiety.            Discharge Care Instructions  (From admission, onward)         Start     Ordered   Aug 17, 2019 0000  Discharge wound care:    Comments: moisten any dressings adhering to the LLE with saline. Then apply non-adherent pads (Telfa or Curad) to LLE. Secure with kerlex. Change Kerlex daily. Change all non-adherent pads with drainage.   08-17-2019 1635         Follow-up Information    Raina Mina., MD Follow up.   Specialty: Internal Medicine Why: as needed Contact information: [address] [place] [address] [phone]          Allergies  Allergen Reactions  . Bisoprolol Shortness Of Breath  . Clonidine Shortness Of Breath  . Contrast Media [Iodinated Diagnostic Agents] Anaphylaxis  . Doxazosin Shortness Of Breath  . Metoprolol Shortness Of Breath  . Ace Inhibitors Cough  . Amlodipine Swelling    Other Procedures/Studies: CT ABDOMEN PELVIS WO CONTRAST  Result Date: 2019-08-17 CLINICAL DATA:  Anemia. EXAM: CT ABDOMEN AND PELVIS WITHOUT CONTRAST TECHNIQUE: Multidetector CT imaging of the abdomen and pelvis was performed following the standard protocol without IV contrast. COMPARISON:  July 29, 2019. FINDINGS: Lower chest:  Moderate bilateral pleural effusions are noted with adjacent atelectasis of both lower lobes. Hepatobiliary: Cholelithiasis is noted. No biliary dilatation is noted. No definite abnormality seen in the liver on these unenhanced images. Pancreas: Unremarkable. No pancreatic ductal dilatation or surrounding inflammatory changes.  Spleen: Normal in size without focal abnormality. Adrenals/Urinary Tract: Adrenal glands and kidneys are unremarkable. No hydronephrosis or renal obstruction is noted. No renal or ureteral calculi are noted. Urinary bladder is decompressed secondary to Foley catheter. Stomach/Bowel: The stomach appears normal. There is no evidence of bowel obstruction or inflammation. Sigmoid diverticulosis is noted without inflammation. The appendix is not visualized. Vascular/Lymphatic: Aortic atherosclerosis. No enlarged abdominal or pelvic lymph nodes. Reproductive: Calcified uterine fibroid is noted. No definite adnexal abnormality is noted. Other: Moderate ascites is again noted as well as moderate anasarca. Musculoskeletal: No acute or significant osseous findings. IMPRESSION: 1. Moderate bilateral pleural effusions are noted with adjacent atelectasis of both lower lobes. 2. Moderate ascites is noted as well as moderate anasarca. 3. Cholelithiasis. 4. Sigmoid diverticulosis without inflammation. 5. Calcified uterine fibroid. Aortic Atherosclerosis (ICD10-I70.0). Electronically Signed   By: Marijo Conception M.D.   On: 08/07/19 11:54   DG Chest 1 View  Result Date: Aug 07, 2019 CLINICAL DATA:  Shortness of breath EXAM: CHEST  1 VIEW COMPARISON:  Jul 23, 2019 FINDINGS: There is airspace opacity in the left upper lobe, similar to recent study. There is consolidation in the left base with small left pleural effusion. The right lung is clear. There is cardiomegaly with pulmonary venous hypertension. No adenopathy. There is aortic atherosclerosis. There is arthropathy in each shoulder. IMPRESSION: Cardiomegaly with pulmonary vascular congestion. Left pleural effusion. Suspect a degree of congestive heart failure. Suspect superimposed pneumonia left upper lobe, although alveolar edema could present in this manner. Both pneumonia and alveolar edema can be present concurrently. Consolidation in the left base may represent atelectasis  and/or pneumonia. Aortic Atherosclerosis (ICD10-I70.0). Electronically Signed   By: Lowella Grip III M.D.   On: 2019-08-07 11:51   CT EXTREMITY LOWER LEFT WO CONTRAST  Result Date: 2019/08/07 CLINICAL DATA:  Left lower extremity pain, swelling and abscess. EXAM: CT OF THE LOWER LEFT EXTREMITY WITHOUT CONTRAST TECHNIQUE: Multidetector CT imaging of the lower left extremity was performed according to the standard protocol. COMPARISON:  None. FINDINGS: Diffuse and severe changes of cellulitis involving the entire left lower extremity and lower pelvic area. Extensive gas throughout the subcutaneous tissues of the left lower extremity below the knee. There are also a few areas of gas in the muscular compartments. Findings highly worrisome for gas gangrene. No obvious necrotizing fasciitis at this point. No evidence of pyomyositis. There is a small knee joint effusion but I do not see any findings suspicious for septic arthritis. Similar findings at the hip and ankle joints. No findings suspicious for osteomyelitis involving the femur, tibia or fibula. Advanced vascular calcifications are noted. IMPRESSION: 1. Diffuse and severe changes of cellulitis involving the entire left lower extremity and lower pelvic area. 2. Extensive gas throughout the subcutaneous tissues of the left lower extremity below the knee. There are also a few areas of gas in the muscular compartments. Findings highly worrisome for gas gangrene. No obvious necrotizing fasciitis at this point. 3. No findings suspicious for septic arthritis or osteomyelitis. 4. Small knee joint effusion. Electronically Signed   By: Marijo Sanes M.D.   On: Aug 07, 2019 12:58     TODAY-DAY OF DISCHARGE:  Subjective:   Lauriel Helin today appears very somnolent.  Objective:   Blood pressure (!) 156/62, pulse 71, temperature 98.2 F (36.8 C), temperature source Oral, resp. rate 16, height 4\' 9"  (1.448 m), weight 47.9 kg, SpO2 92 %.  Intake/Output Summary  (Last 24 hours) at 08-20-2019 1635 Last data filed at 20-Aug-2019 1453 Gross per 24 hour  Intake 215.16 ml  Output 675 ml  Net -459.84 ml   Filed Weights   08/20/19 0400  Weight: 47.9 kg    Exam: Appears very somnolent.   PERTINENT RADIOLOGIC STUDIES: CT ABDOMEN PELVIS WO CONTRAST  Result Date: 08-20-2019 CLINICAL DATA:  Anemia. EXAM: CT ABDOMEN AND PELVIS WITHOUT CONTRAST TECHNIQUE: Multidetector CT imaging of the abdomen and pelvis was performed following the standard protocol without IV contrast. COMPARISON:  July 29, 2019. FINDINGS: Lower chest: Moderate bilateral pleural effusions are noted with adjacent atelectasis of both lower lobes. Hepatobiliary: Cholelithiasis is noted. No biliary dilatation is noted. No definite abnormality seen in the liver on these unenhanced images. Pancreas: Unremarkable. No pancreatic ductal dilatation or surrounding inflammatory changes. Spleen: Normal in size without focal abnormality. Adrenals/Urinary Tract: Adrenal glands and kidneys are unremarkable. No hydronephrosis or renal obstruction is noted. No renal or ureteral calculi are noted. Urinary bladder is decompressed secondary to Foley catheter. Stomach/Bowel: The stomach appears normal. There is no evidence of bowel obstruction or inflammation. Sigmoid diverticulosis is noted without inflammation. The appendix is not visualized. Vascular/Lymphatic: Aortic atherosclerosis. No enlarged abdominal or pelvic lymph nodes. Reproductive: Calcified uterine fibroid is noted. No definite adnexal abnormality is noted. Other: Moderate ascites is again noted as well as moderate anasarca. Musculoskeletal: No acute or significant osseous findings. IMPRESSION: 1. Moderate bilateral pleural effusions are noted with adjacent atelectasis of both lower lobes. 2. Moderate ascites is noted as well as moderate anasarca. 3. Cholelithiasis. 4. Sigmoid diverticulosis without inflammation. 5. Calcified uterine fibroid. Aortic  Atherosclerosis (ICD10-I70.0). Electronically Signed   By: Marijo Conception M.D.   On: August 20, 2019 11:54   DG Chest 1 View  Result Date: August 20, 2019 CLINICAL DATA:  Shortness of breath EXAM: CHEST  1 VIEW COMPARISON:  Jul 23, 2019 FINDINGS: There is airspace opacity in the left upper lobe, similar to recent study. There is consolidation in the left base with small left pleural effusion. The right lung is clear. There is cardiomegaly with pulmonary venous hypertension. No adenopathy. There is aortic atherosclerosis. There is arthropathy in each shoulder. IMPRESSION: Cardiomegaly with pulmonary vascular congestion. Left pleural effusion. Suspect a degree of congestive heart failure. Suspect superimposed pneumonia left upper lobe, although alveolar edema could present in this manner. Both pneumonia and alveolar edema can be present concurrently. Consolidation in the left base may represent atelectasis and/or pneumonia. Aortic Atherosclerosis (ICD10-I70.0). Electronically Signed   By: Lowella Grip III M.D.   On: 08-20-19 11:51   CT EXTREMITY LOWER LEFT WO CONTRAST  Result Date: Aug 20, 2019 CLINICAL DATA:  Left lower extremity pain, swelling and abscess. EXAM: CT OF THE LOWER LEFT EXTREMITY WITHOUT CONTRAST TECHNIQUE: Multidetector CT imaging of the lower left extremity was performed according to the standard protocol. COMPARISON:  None. FINDINGS: Diffuse and severe changes of cellulitis involving the entire left lower extremity and lower pelvic area. Extensive gas throughout the subcutaneous tissues of the left lower extremity below the knee. There are also a few areas of gas in the muscular compartments. Findings highly worrisome for gas gangrene. No obvious necrotizing fasciitis at this point. No evidence of pyomyositis. There is a small knee joint effusion but I do not see any findings  suspicious for septic arthritis. Similar findings at the hip and ankle joints. No findings suspicious for osteomyelitis  involving the femur, tibia or fibula. Advanced vascular calcifications are noted. IMPRESSION: 1. Diffuse and severe changes of cellulitis involving the entire left lower extremity and lower pelvic area. 2. Extensive gas throughout the subcutaneous tissues of the left lower extremity below the knee. There are also a few areas of gas in the muscular compartments. Findings highly worrisome for gas gangrene. No obvious necrotizing fasciitis at this point. 3. No findings suspicious for septic arthritis or osteomyelitis. 4. Small knee joint effusion. Electronically Signed   By: Marijo Sanes M.D.   On: 07/31/2019 12:58     PERTINENT LAB RESULTS: CBC: Recent Labs    Jul 31, 2019 0450  WBC 21.8*  HGB 8.8*  HCT 26.3*  PLT 183   CMET CMP     Component Value Date/Time   NA 131 (L) 2019-07-31 0450   K 4.7 2019/07/31 0450   CL 98 July 31, 2019 0450   CO2 21 (L) Jul 31, 2019 0450   GLUCOSE 197 (H) Jul 31, 2019 0450   BUN 99 (H) 07/31/19 0450   CREATININE 2.55 (H) July 31, 2019 0450   CALCIUM 9.5 2019-07-31 0450   PROT 5.0 (L) 2019/07/31 0450   ALBUMIN 2.0 (L) Jul 31, 2019 0450   AST 20 Jul 31, 2019 0450   ALT 11 07-31-2019 0450   ALKPHOS 144 (H) 31-Jul-2019 0450   BILITOT 1.0 July 31, 2019 0450   GFRNONAA 16 (L) 2019-07-31 0450   GFRAA 19 (L) 07/31/19 0450    GFR Estimated Creatinine Clearance: 10.4 mL/min (A) (by C-G formula based on SCr of 2.55 mg/dL (H)). No results for input(s): LIPASE, AMYLASE in the last 72 hours. No results for input(s): CKTOTAL, CKMB, CKMBINDEX, TROPONINI in the last 72 hours. Invalid input(s): POCBNP No results for input(s): DDIMER in the last 72 hours. Recent Labs    07-31-19 0450  HGBA1C 7.8*   No results for input(s): CHOL, HDL, LDLCALC, TRIG, CHOLHDL, LDLDIRECT in the last 72 hours. No results for input(s): TSH, T4TOTAL, T3FREE, THYROIDAB in the last 72 hours.  Invalid input(s): FREET3 No results for input(s): VITAMINB12, FOLATE, FERRITIN, TIBC, IRON, RETICCTPCT in  the last 72 hours. Coags: No results for input(s): INR in the last 72 hours.  Invalid input(s): PT Microbiology: No results found for this or any previous visit (from the past 240 hour(s)).  FURTHER DISCHARGE INSTRUCTIONS:  Get Medicines reviewed and adjusted: Please take all your medications with you for your next visit with your Primary MD  Laboratory/radiological data: Please request your Primary MD to go over all hospital tests and procedure/radiological results at the follow up, please ask your Primary MD to get all Hospital records sent to his/her office.  In some cases, they will be blood work, cultures and biopsy results pending at the time of your discharge. Please request that your primary care M.D. goes through all the records of your hospital data and follows up on these results.  Also Note the following: If you experience worsening of your admission symptoms, develop shortness of breath, life threatening emergency, suicidal or homicidal thoughts you must seek medical attention immediately by calling 911 or calling your MD immediately  if symptoms less severe.  You must read complete instructions/literature along with all the possible adverse reactions/side effects for all the Medicines you take and that have been prescribed to you. Take any new Medicines after you have completely understood and accpet all the possible adverse reactions/side effects.   Do not drive  when taking Pain medications or sleeping medications (Benzodaizepines)  Do not take more than prescribed Pain, Sleep and Anxiety Medications. It is not advisable to combine anxiety,sleep and pain medications without talking with your primary care practitioner  Special Instructions: If you have smoked or chewed Tobacco  in the last 2 yrs please stop smoking, stop any regular Alcohol  and or any Recreational drug use.  Wear Seat belts while driving.  Please note: You were cared for by a hospitalist during your  hospital stay. Once you are discharged, your primary care physician will handle any further medical issues. Please note that NO REFILLS for any discharge medications will be authorized once you are discharged, as it is imperative that you return to your primary care physician (or establish a relationship with a primary care physician if you do not have one) for your post hospital discharge needs so that they can reassess your need for medications and monitor your lab values.  Total Time spent coordinating discharge including counseling, education and face to face time equals 35 minutes.  SignedOren Binet 08/28/2019 4:35 PM

## 2019-08-26 NOTE — H&P (Signed)
History and Physical    Melinda Hernandez IOX:[id number] DOB: 03/04/1929 DOA: 08/12/2019  PCP: Raina Mina., MD   Patient coming from: Home   Chief Complaint: Somnolence   HPI: Melinda Hernandez is a 86 y.o. female with medical history significant for type 2 diabetes mellitus, hypertension, coronary artery disease, chronic kidney disease stage IV, and chronic left lower extremity wounds followed by home health RN, presented to the emergency department with progressive general weakness and somnolence.  Patient had denied any shortness of breath, nausea, vomiting, or abdominal pain.  She also denied fevers, chills, or chest pain.  She had not noticed any melena or hematochezia.  [facility] ED and Hospital course: Upon arrival to the ED, patient is found to be hypertensive to 195/80 with leukocytosis and bandemia, hyponatremia, hypokalemia, and mild elevation in troponin.  Covid screening test was negative.  Chest x-ray demonstrated bilateral pleural effusions and cardiomegaly.  Patient was given IV fluid hydration, Rocephin, and clindamycin in the emergency department and the patient was admitted to the hospitalist service.  She underwent CT of the left lower extremity that was concerning for severe cellulitis without drainable fluid collection or evidence for osteomyelitis.  Antibiotics were switched to linezolid and Ancef.  She has had spontaneous purulent drainage from the left leg.    She had a drop in hemoglobin of 6.6 for which she was transfused with 2 units of RBC on 07/27/2019 but 2 days later hemoglobin was back down to 6.3 for which she was given an additional 4 units of RBC.  Fecal occult blood testing was negative but concern for possible GI source of blood loss persisted and so her Protonix was increased to twice daily and her aspirin and Plavix were held.  She was also started on Pepcid and Carafate.  She developed acute kidney injury that resolved with IV fluid hydration but the patient developed  acute on chronic diastolic CHF and was diuresed.  She continued to require 2 L/min of supplemental oxygen and had repeat chest imaging concerning for bilateral pleural effusions.  Given concern that the patient may be losing blood from her GI tract, and lack of gastroenterology backup at [facility], the patient was transferred to [facility].  Review of Systems:  All other systems reviewed and apart from HPI, are negative.  History reviewed. No pertinent past medical history.  History reviewed. No pertinent surgical history.   has no history on file for tobacco, alcohol, and drug.  Not on File  History reviewed. No pertinent family history.   Prior to Admission medications   Not on File    Physical Exam: Vitals:   08-12-2019 0052  BP: (!) 178/54  Pulse: 60  Resp: 19  Temp: 98.2 F (36.8 C)  SpO2: 98%    Constitutional: NAD, calm  Eyes: PERTLA, lids and conjunctivae normal ENMT: Mucous membranes are moist. Posterior pharynx clear of any exudate or lesions.   Neck: normal, supple, no masses, no thyromegaly Respiratory: Dyspneic with speech, no wheezing, no crackles. No pallor or cyanosis.  Cardiovascular: S1 & S2 heard, regular rate and rhythm. No JVD.   Abdomen: Soft, mild distension, no tenderness. Bowel sounds active.  Musculoskeletal: no clubbing / cyanosis. No joint deformity upper and lower extremities.   Skin: lateral aspect left leg with wound, eschar, purulent drainage, and surrounding erythema. Warm, dry, well-perfused. Neurologic: No gross facial asymmetry. Sensation intact. Moving all extremities.  Psychiatric: Alert and oriented to person only. Calm and cooperative.    Labs and  Imaging on Admission: I have personally reviewed following labs and imaging studies  CBC: No results for input(s): WBC, NEUTROABS, HGB, HCT, MCV, PLT in the last 168 hours. Basic Metabolic Panel: No results for input(s): NA, K, CL, CO2, GLUCOSE, BUN, CREATININE, CALCIUM, MG, PHOS in  the last 168 hours. GFR: CrCl cannot be calculated (No successful lab value found.). Liver Function Tests: No results for input(s): AST, ALT, ALKPHOS, BILITOT, PROT, ALBUMIN in the last 168 hours. No results for input(s): LIPASE, AMYLASE in the last 168 hours. No results for input(s): AMMONIA in the last 168 hours. Coagulation Profile: No results for input(s): INR, PROTIME in the last 168 hours. Cardiac Enzymes: No results for input(s): CKTOTAL, CKMB, CKMBINDEX, TROPONINI in the last 168 hours. BNP (last 3 results) No results for input(s): PROBNP in the last 8760 hours. HbA1C: No results for input(s): HGBA1C in the last 72 hours. CBG: No results for input(s): GLUCAP in the last 168 hours. Lipid Profile: No results for input(s): CHOL, HDL, LDLCALC, TRIG, CHOLHDL, LDLDIRECT in the last 72 hours. Thyroid Function Tests: No results for input(s): TSH, T4TOTAL, FREET4, T3FREE, THYROIDAB in the last 72 hours. Anemia Panel: No results for input(s): VITAMINB12, FOLATE, FERRITIN, TIBC, IRON, RETICCTPCT in the last 72 hours. Urine analysis: No results found for: COLORURINE, APPEARANCEUR, LABSPEC, PHURINE, GLUCOSEU, HGBUR, BILIRUBINUR, KETONESUR, PROTEINUR, UROBILINOGEN, NITRITE, LEUKOCYTESUR Sepsis Labs: @LABRCNTIP (procalcitonin:4,lacticidven:4) )No results found for this or any previous visit (from the past 240 hour(s)).   Radiological Exams on Admission: No results found.  EKG: Independently reviewed. Undetermined rhythm, non-specific ST-T abnormality.   Assessment/Plan   1. Sepsis secondary to cellulitis  - Patient has chronic LLE wounds and presented to ED with progressive somnolence and was found to have leukocytosis with left-shift, purulent drainage from left leg, and CT findings concerning for severe cellulitis without evidence for osteomyelitis or drainable fluid collection  - Continue treatment with vancomycin, follow cultures and clinical response to treatment, consider repeat  imaging and surgical consultation if fails to significantly improve with antibiotics    2. Acute on chronic diastolic CHF; bilateral pleural effusions; acute hypoxic respiratory failure  - Patient was hydrated with IVF for AKI and also received 6 units RBC at [facility], developed acute CHF with new supplemental O2 requirement, and was noted to have bilateral pleural effusions  - She was diuresed with IV Lasix at [facility], follow daily wt and I/Os, continue supplemental O2 as needed, consult IR for thoracentesis   3. Symptomatic anemia  - She has received 6 units RBC at [facility], there was concern for GIB despite negative FOBT, her ASA and Plavix have been on hold, her Protonix was increased to BID, and she has been started on Carafate  - Type & screen, check CBC, repeat FOBT    4. CKD IV  - She had AKI at [facility] with SCr reaching 3.0, back down to 2.3 after IVF  - Renally-dose medications, monitor   5. CAD  - No anginal complaints  - ASA and Plavix have been on hold d/t concern for possible GI bleeding   6. Hypertension  - Treat as needed only for now given some low BP readings at [place]  7. Type II DM  - Continue CBG checks and SSI    8. Hypothyroidism  - TSH was normal at [place], continue Synthroid    9. Dysphagia  - SLP at [facility] recommended pureed diet, currently NPO for thoracentesis   10. Acute encephalopathy  - Improved significantly  prior to transfer and was likely secondary to sepsis    DVT prophylaxis: SCDs  Code Status: DNR  Family Communication: Discussed with patient  Disposition Plan:  Patient is from: Home   Anticipated d/c is to: TBD, likely SNF   Anticipated d/c date is: 08/03/19 Patient currently: Requiring IV antibiotics, H&H monitoring and possible transfusion, has new supplemental O2 requirement  Consults called: None  Admission status: Inpatient     Vianne Bulls, MD [facility] Pager: See [email or web address]  If 7AM-7PM, please  contact the daytime attending [email or web address]  08/12/2019, 3:53 AM

## 2019-08-26 NOTE — Progress Notes (Signed)
Patient remains somnolent-son at bedside. Long discussion-he understands the tenuous clinical situation-and feels that his mother has suffered enough. He does not want to pursue any sort of surgical procedures-he wants to transition to full comfort measures-and is requesting that we transfer to [place]. Will transition to full comfort measures-have asked case management/social work to see if patient can be transferred to residential hospice.

## 2019-08-26 NOTE — TOC Transition Note (Signed)
Transition of Care [facility]) - CM/SW Discharge Note   Patient Details  Name: Melinda Hernandez MRN: [id number] Date of Birth: 1929-07-24  Transition of Care [facility]) CM/SW Contact:  Bartholomew Crews, RN Phone Number: [phone] 08/16/2019, 3:37 PM   Clinical Narrative:     Notified by Carmel [place] at [place] that patient can transition to [place] today. PTAR arranged for transport - medical transport paperwork and DNR on chart. Nursing aware - report to be called to [phone]. No further TOC needs identified.   Final next level of care: [place] Barriers to Discharge: No Barriers Identified   Patient Goals and CMS Choice Patient states their goals for this hospitalization and ongoing recovery are:: inpatient hospice at [facility].gov Compare Post Acute Care list provided to:: Patient Represenative (must comment) Choice offered to / list presented to : Adult Children  Discharge Placement              Patient chooses bed at: ([place]) Patient to be transferred to facility by: [place] Name of family member notified: Donnah Levert Patient and family notified of of transfer: 08/16/19  Discharge Plan and Services In-house Referral: Clinical Social Work Discharge Planning Services: CM Consult Post Acute Care Choice: Hospice                               Social Determinants of Health (SDOH) Interventions     Readmission Risk Interventions No flowsheet data found.

## 2019-08-26 NOTE — Progress Notes (Signed)
Pharmacy Antibiotic Note  Melinda Hernandez is a 86 y.o. female admitted on 28-Aug-2019 with cellulitis and multiple wounds. Transferred from [place] and has been on multiple antibiotics  Scr 2.3 at [place]   Weight: 47.9 kg (105 lb 9.6 oz)  Temp (24hrs), Avg:98.2 F (36.8 C), Min:98.2 F (36.8 C), Max:98.2 F (36.8 C)  No results for input(s): WBC, CREATININE, LATICACIDVEN, VANCOTROUGH, VANCOPEAK, VANCORANDOM, GENTTROUGH, GENTPEAK, GENTRANDOM, TOBRATROUGH, TOBRAPEAK, TOBRARND, AMIKACINPEAK, AMIKACINTROU, AMIKACIN in the last 168 hours.  CrCl cannot be calculated (No successful lab value found.).    Not on File  Plan Vanc 1 g x 1  F/U renal fx in am for further dosing  Barth Kirks, PharmD, BCPS, BCCCP Clinical Pharmacist [phone]  Please check AMION for all [place] numbers  2019/08/28 4:48 AM

## 2019-08-26 NOTE — Progress Notes (Signed)
Patient ID: Melinda Hernandez, female   DOB: 04/27/1929, 86 y.o.   MRN: [id number]   Palliative:  PMT was consulted for [name]. Reviewed the chart - per notes, goals of care were clear. Discussed with Dr. Sloan Leiter and he cancelled the consult order. He stated family arrived after the order was placed and the PMT services are no longer needed.   NO CHARGE  Kerith Sherley M. Jonelle Bann FNP-BC

## 2019-08-26 NOTE — Progress Notes (Signed)
Patient discharged to [facility] via EMS.  Gave report to [place] the primary RN and answered all her questions. Patient left the unit with peripheral IVs x2 and foley catheter as per recommendations.  All patient belongings taken by the grand daughter.

## 2019-08-26 NOTE — Consult Note (Signed)
Reason for Consult:LLE infection Referring Physician: S Ghimire  Melinda Hernandez is an 86 y.o. female.  HPI: Kalyiah was transferred from [facility] yesterday for possible GIB. She has had long-standing left lower extremity wounds that she has been receiving [place] for. These got acutely worse while in the hospital at [facility]. She has been getting IV abx without much improvement. Her son notes that it seems to hurt her quite a bit when something has to be done to it. She is somnolent and quite Ho[facility] so history and exam were very limited.  History reviewed. No pertinent past medical history.  History reviewed. No pertinent surgical history.  History reviewed. No pertinent family history.  Social History:  has no history on file for tobacco, alcohol, and drug.  Allergies:  Allergies  Allergen Reactions  . Bisoprolol Shortness Of Breath  . Clonidine Shortness Of Breath  . Contrast Media [Iodinated Diagnostic Agents] Anaphylaxis  . Doxazosin Shortness Of Breath  . Metoprolol Shortness Of Breath  . Ace Inhibitors Cough  . Amlodipine Swelling    Medications: I have reviewed the patient's current medications.  Results for orders placed or performed during the hospital encounter of 2019/08/07 (from the past 48 hour(s))  Hemoglobin A1c     Status: Abnormal   Collection Time: 08/07/2019  4:50 AM  Result Value Ref Range   Hgb A1c MFr Bld 7.8 (H) 4.8 - 5.6 %    Comment: (NOTE) Pre diabetes:          5.7%-6.4% Diabetes:              >6.4% Glycemic control for   <7.0% adults with diabetes    Mean Plasma Glucose 177.16 mg/dL    Comment: Performed at [facility], [place] [address], [place], [place] [address]  Comprehensive metabolic panel     Status: Abnormal   Collection Time: 2019-08-07  4:50 AM  Result Value Ref Range   Sodium 131 (L) 135 - 145 mmol/L   Potassium 4.7 3.5 - 5.1 mmol/L   Chloride 98 98 - 111 mmol/L   CO2 21 (L) 22 - 32 mmol/L   Glucose, Bld 197 (H) 70 - 99 mg/dL     Comment: Glucose reference range applies only to samples taken after fasting for at least 8 hours.   BUN 99 (H) 8 - 23 mg/dL   Creatinine, Ser 2.55 (H) 0.44 - 1.00 mg/dL   Calcium 9.5 8.9 - 10.3 mg/dL   Total Protein 5.0 (L) 6.5 - 8.1 g/dL   Albumin 2.0 (L) 3.5 - 5.0 g/dL   AST 20 15 - 41 U/L   ALT 11 0 - 44 U/L   Alkaline Phosphatase 144 (H) 38 - 126 U/L   Total Bilirubin 1.0 0.3 - 1.2 mg/dL   GFR calc non Af Amer 16 (L) >60 mL/min   GFR calc Af Amer 19 (L) >60 mL/min   Anion gap 12 5 - 15    Comment: Performed at [facility] [address], [place], [place] [address]  CBC WITH DIFFERENTIAL     Status: Abnormal   Collection Time: 08-07-2019  4:50 AM  Result Value Ref Range   WBC 21.8 (H) 4.0 - 10.5 K/uL   RBC 3.13 (L) 3.87 - 5.11 MIL/uL   Hemoglobin 8.8 (L) 12.0 - 15.0 g/dL   HCT 26.3 (L) 36.0 - 46.0 %   MCV 84.0 80.0 - 100.0 fL   MCH 28.1 26.0 - 34.0 pg   MCHC  33.5 30.0 - 36.0 g/dL   RDW 16.5 (H) 11.5 - 15.5 %   Platelets 183 150 - 400 K/uL   nRBC 0.1 0.0 - 0.2 %   Neutrophils Relative % 95 %   Neutro Abs 20.6 (H) 1.7 - 7.7 K/uL   Lymphocytes Relative 1 %   Lymphs Abs 0.3 (L) 0.7 - 4.0 K/uL   Monocytes Relative 3 %   Monocytes Absolute 0.7 0.1 - 1.0 K/uL   Eosinophils Relative 0 %   Eosinophils Absolute 0.0 0.0 - 0.5 K/uL   Basophils Relative 0 %   Basophils Absolute 0.0 0.0 - 0.1 K/uL   Immature Granulocytes 1 %   Abs Immature Granulocytes 0.21 (H) 0.00 - 0.07 K/uL    Comment: Performed at [facility] [address] [place], [place] [address]  Type and screen [place]     Status: None   Collection Time: August 14, 2019  4:50 AM  Result Value Ref Range   ABO/RH(D) O POS    Antibody Screen NEG    Sample Expiration      08/02/2019,2359 Performed at [facility], [place] [address], [place], [place] [address]   ABO/Rh     Status: None   Collection Time: 08-14-2019  4:50 AM  Result Value Ref Range   ABO/RH(D)      O POS Performed at  [facility] [address], [place], [place] [address]   Glucose, capillary     Status: Abnormal   Collection Time: 14-Aug-2019  8:12 AM  Result Value Ref Range   Glucose-Capillary 198 (H) 70 - 99 mg/dL    Comment: Glucose reference range applies only to samples taken after fasting for at least 8 hours.  Glucose, capillary     Status: Abnormal   Collection Time: 2019-08-14 11:42 AM  Result Value Ref Range   Glucose-Capillary 185 (H) 70 - 99 mg/dL    Comment: Glucose reference range applies only to samples taken after fasting for at least 8 hours.    CT ABDOMEN PELVIS WO CONTRAST  Result Date: 08-14-2019 CLINICAL DATA:  Anemia. EXAM: CT ABDOMEN AND PELVIS WITHOUT CONTRAST TECHNIQUE: Multidetector CT imaging of the abdomen and pelvis was performed following the standard protocol without IV contrast. COMPARISON:  July 29, 2019. FINDINGS: Lower chest: Moderate bilateral pleural effusions are noted with adjacent atelectasis of both lower lobes. Hepatobiliary: Cholelithiasis is noted. No biliary dilatation is noted. No definite abnormality seen in the liver on these unenhanced images. Pancreas: Unremarkable. No pancreatic ductal dilatation or surrounding inflammatory changes. Spleen: Normal in size without focal abnormality. Adrenals/Urinary Tract: Adrenal glands and kidneys are unremarkable. No hydronephrosis or renal obstruction is noted. No renal or ureteral calculi are noted. Urinary bladder is decompressed secondary to Foley catheter. Stomach/Bowel: The stomach appears normal. There is no evidence of bowel obstruction or inflammation. Sigmoid diverticulosis is noted without inflammation. The appendix is not visualized. Vascular/Lymphatic: Aortic atherosclerosis. No enlarged abdominal or pelvic lymph nodes. Reproductive: Calcified uterine fibroid is noted. No definite adnexal abnormality is noted. Other: Moderate ascites is again noted as well as moderate anasarca. Musculoskeletal: No acute or  significant osseous findings. IMPRESSION: 1. Moderate bilateral pleural effusions are noted with adjacent atelectasis of both lower lobes. 2. Moderate ascites is noted as well as moderate anasarca. 3. Cholelithiasis. 4. Sigmoid diverticulosis without inflammation. 5. Calcified uterine fibroid. Aortic Atherosclerosis (ICD10-I70.0). Electronically Signed   By: Marijo Conception M.D.   On: 08-14-19 11:54  DG Chest 1 View  Result Date: 08/05/2019 CLINICAL DATA:  Shortness of breath EXAM: CHEST  1 VIEW COMPARISON:  Jul 23, 2019 FINDINGS: There is airspace opacity in the left upper lobe, similar to recent study. There is consolidation in the left base with small left pleural effusion. The right lung is clear. There is cardiomegaly with pulmonary venous hypertension. No adenopathy. There is aortic atherosclerosis. There is arthropathy in each shoulder. IMPRESSION: Cardiomegaly with pulmonary vascular congestion. Left pleural effusion. Suspect a degree of congestive heart failure. Suspect superimposed pneumonia left upper lobe, although alveolar edema could present in this manner. Both pneumonia and alveolar edema can be present concurrently. Consolidation in the left base may represent atelectasis and/or pneumonia. Aortic Atherosclerosis (ICD10-I70.0). Electronically Signed   By: Lowella Grip III M.D.   On: 2019/08/05 11:51    Review of Systems  Unable to perform ROS: Other   Blood pressure (!) 156/62, pulse 71, temperature 98.2 F (36.8 C), temperature source Oral, resp. rate 16, height 4\' 9"  (1.448 m), weight 47.9 kg, SpO2 92 %. Physical Exam  Constitutional: She appears well-developed and well-nourished. No distress.  HENT:  Head: Normocephalic and atraumatic.  Eyes: Conjunctivae are normal. Right eye exhibits no discharge. Left eye exhibits no discharge. No scleral icterus.  Cardiovascular: Normal rate and regular rhythm.  Respiratory: Effort normal. No respiratory distress.  Musculoskeletal:      Cervical back: Normal range of motion.     Comments: LLE No traumatic wounds or rash, extensive discoloration and SQ edema from tibia tubercle distally, esp anterior, foul odor, purulent discharge from distal demarcation, seemingly not TTP though pt somnolent most of the time  No knee or ankle effusion  Knee stable to varus/ valgus and anterior/posterior stress  Sens DPN, SPN, TN could not assess  Motor EHL, ext, flex, evers could not assess  DP 0, PT 0, No significant edema  Neurological: She is alert.  Skin: Skin is warm and dry. She is not diaphoretic.  Psychiatric: She has a normal mood and affect. Her behavior is normal.    Assessment/Plan: LLE cellulitis -- Were she in better health this would require surgical debridement to give her any hope of clearing the infection. This would leave her with a massive wound that would eventually need plastics involvement with likely skin grafting. The other option (and probably the better one) would be an amputation. In addition, I think it very likely that she would need some sort of revascularization procedure as well in order to heal any wounds on that leg. Apparently there was talk of involving Hospice up at [place]. The son is leaning towards that as he does not think she would want to go through all of that. I'm afraid that no matter which course we go down this will represent a terminal event for her and we can only prolong the inevitable. Attending to return to discuss with son. Orthopedic surgery is available if needed to provide surgical remedy should the family decide on that. Multiple medical problems including type 2 diabetes mellitus, hypertension, coronary artery disease, chronic kidney disease stage IV, and chronic left lower extremity wounds -- per primary service    Lisette Abu, PA-C Orthopedic Surgery [phone] 2019-08-05, 12:09 PM

## 2019-08-26 DEATH — deceased

## 2021-06-13 IMAGING — CT CT EXTREM LOW W/O CM*L*
2 of 3 series · 10 of 46 positions shown, 11 images · non-contrast
Comparison: None.

CLINICAL DATA: Left lower extremity pain, swelling and abscess.

EXAM:
CT OF THE LOWER LEFT EXTREMITY WITHOUT CONTRAST
TECHNIQUE: Multidetector CT imaging of the lower left extremity was performed
according to the standard protocol.

[Series 5: lfov ext 3.0 b40s · axial · 0.57mm/px · z∈[+390,+1143]mm · 7 of 311 slices shown, 8 images]
[im 31/311  soft-tissue]
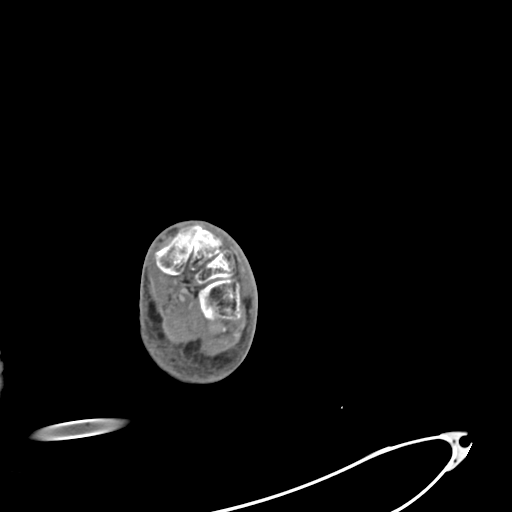
[im 31/311  bone]
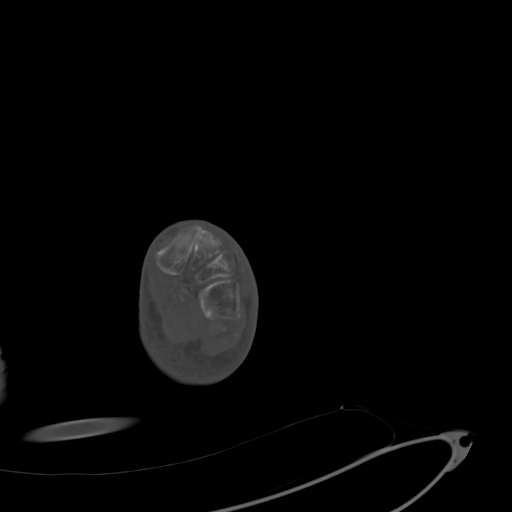
[im 71/311  soft-tissue]
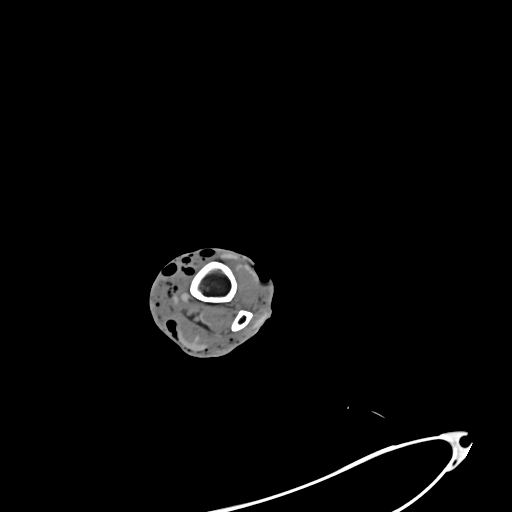
[im 111/311  soft-tissue]
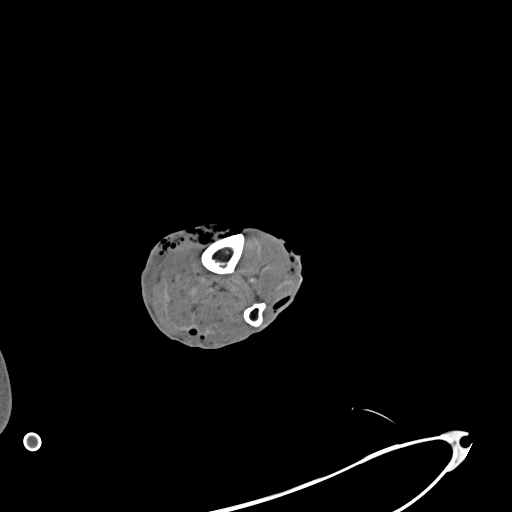
[im 161/311  soft-tissue]
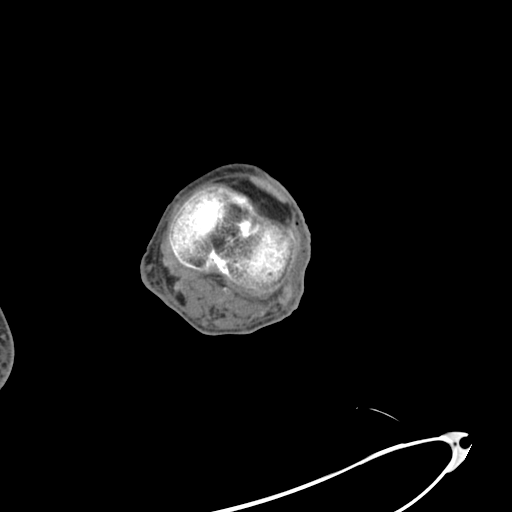
[im 201/311  soft-tissue]
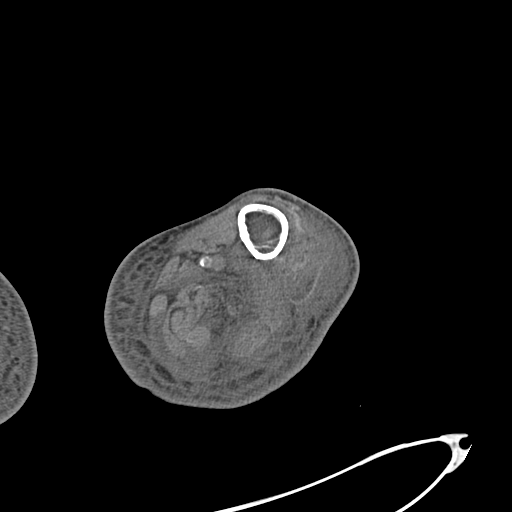
[im 241/311  soft-tissue]
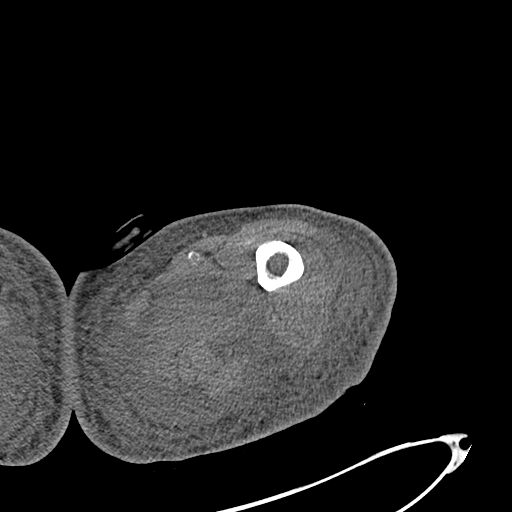
[im 281/311  soft-tissue]
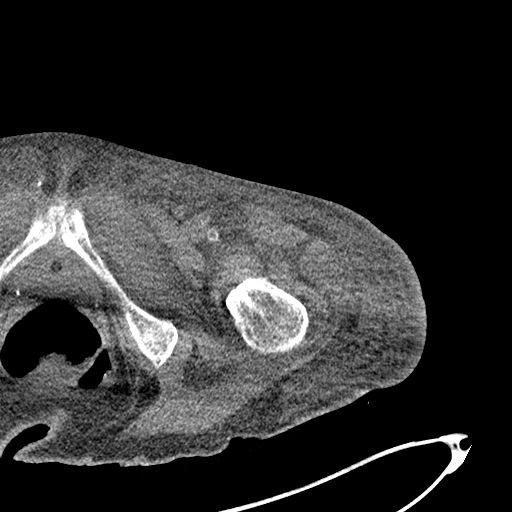

[Series 8: coronalsoft tissue · coronal · 0.49mm/px · 3 of 125 slices shown]
[im 42/125  soft-tissue]
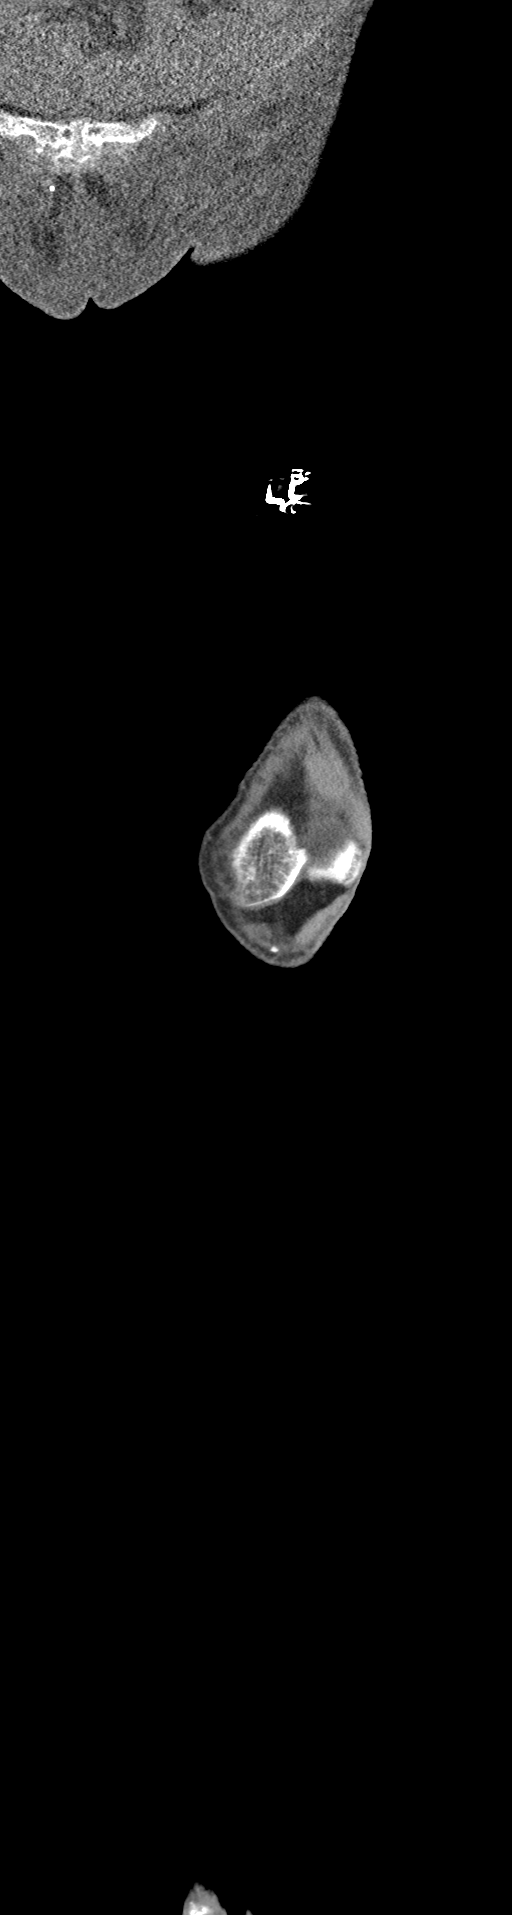
[im 56/125  soft-tissue]
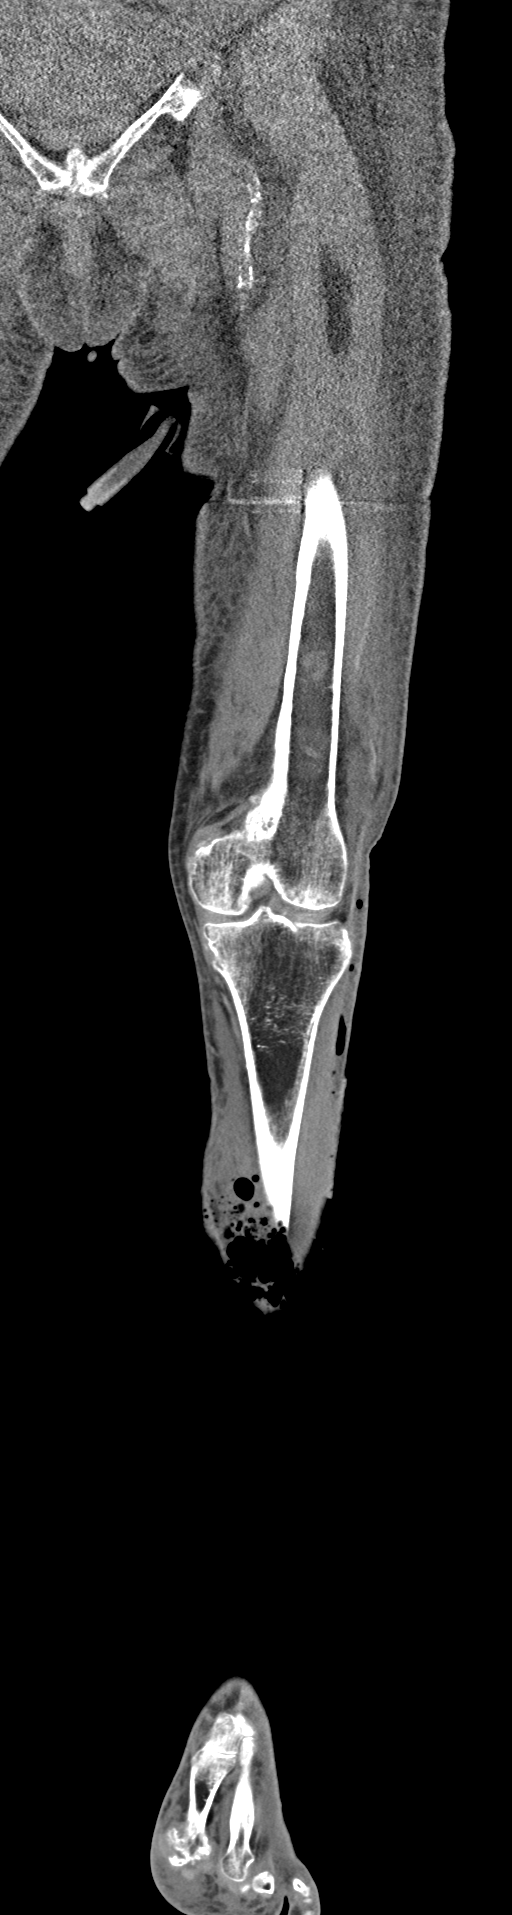
[im 69/125  soft-tissue]
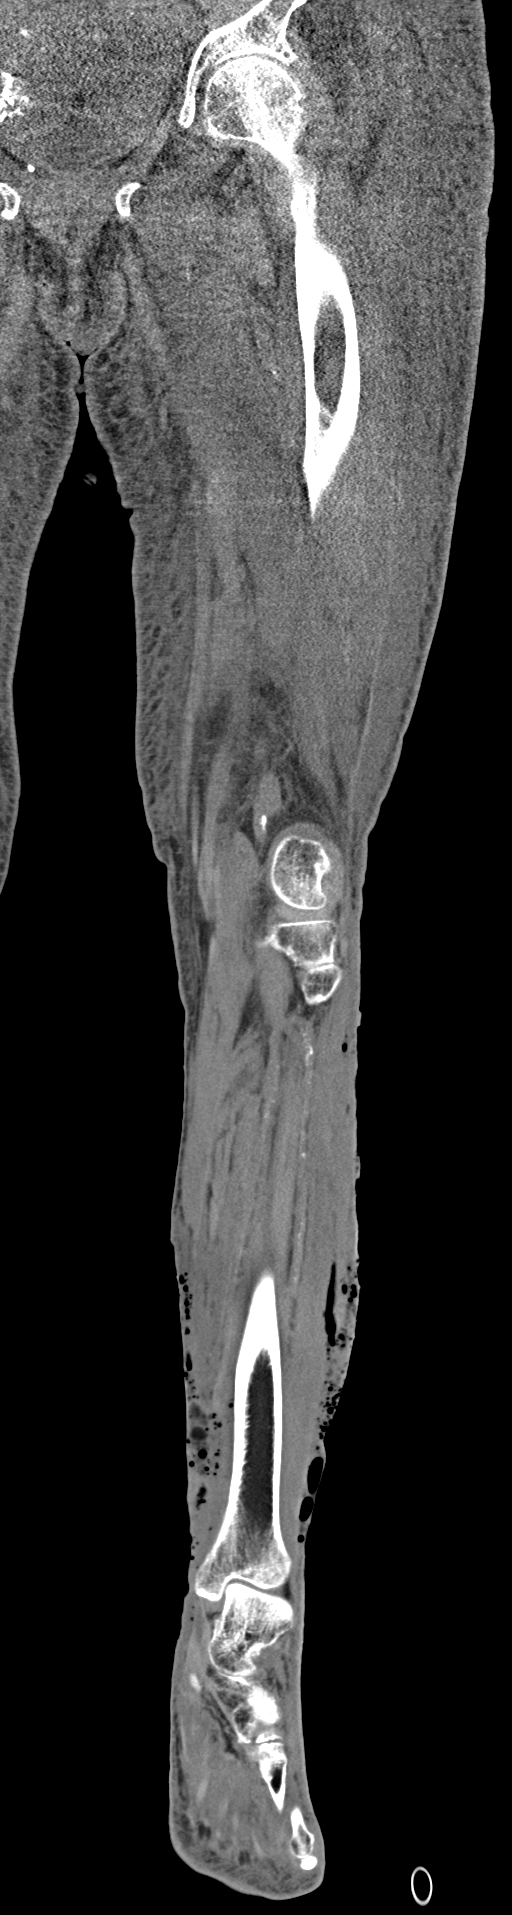

[10 of 46 positions shown; findings below may reference images not displayed]

FINDINGS: Diffuse and severe changes of cellulitis involving the entire left
lower extremity and lower pelvic area. Extensive gas throughout the
subcutaneous tissues of the left lower extremity below the knee.
There are also a few areas of gas in the muscular compartments.
Findings highly worrisome for gas gangrene. No obvious necrotizing
fasciitis at this point. No evidence of pyomyositis.

There is a small knee joint effusion but I do not see any findings
suspicious for septic arthritis. Similar findings at the hip and
ankle joints.

No findings suspicious for osteomyelitis involving the femur, tibia
or fibula.

Advanced vascular calcifications are noted.
IMPRESSION: 1. Diffuse and severe changes of cellulitis involving the entire
left lower extremity and lower pelvic area.
2. Extensive gas throughout the subcutaneous tissues of the left
lower extremity below the knee. There are also a few areas of gas in
the muscular compartments. Findings highly worrisome for gas
gangrene. No obvious necrotizing fasciitis at this point.
3. No findings suspicious for septic arthritis or osteomyelitis.
4. Small knee joint effusion.
